# Patient Record
Sex: Male | Born: 1950
Health system: Southern US, Community
[De-identification: ages and names within clinical notes are randomized; demographics above are authoritative.]

## PROBLEM LIST (undated history)

## (undated) DIAGNOSIS — M419 Scoliosis, unspecified: Secondary | ICD-10-CM

## (undated) DIAGNOSIS — I10 Essential (primary) hypertension: Secondary | ICD-10-CM

## (undated) DIAGNOSIS — N4281 Prostatodynia syndrome: Secondary | ICD-10-CM

## (undated) DIAGNOSIS — I2510 Atherosclerotic heart disease of native coronary artery without angina pectoris: Secondary | ICD-10-CM

## (undated) DIAGNOSIS — M47816 Spondylosis without myelopathy or radiculopathy, lumbar region: Secondary | ICD-10-CM

## (undated) DIAGNOSIS — N200 Calculus of kidney: Secondary | ICD-10-CM

## (undated) DIAGNOSIS — E785 Hyperlipidemia, unspecified: Secondary | ICD-10-CM

## (undated) HISTORY — PX: OTHER SURGICAL HISTORY: SHX169

## (undated) HISTORY — PX: KNEE ARTHROSCOPY: SHX127

## (undated) HISTORY — PX: CHOLECYSTECTOMY: SHX55

## (undated) HISTORY — PX: CARDIAC CATHETERIZATION: SHX172

---

## 1998-05-26 ENCOUNTER — Observation Stay (HOSPITAL_COMMUNITY): Admission: RE | Admit: 1998-05-26 | Discharge: 1998-05-27 | Payer: Self-pay | Admitting: *Deleted

## 1998-07-18 ENCOUNTER — Emergency Department (HOSPITAL_COMMUNITY): Admission: EM | Admit: 1998-07-18 | Discharge: 1998-07-18 | Payer: Self-pay | Admitting: Emergency Medicine

## 1998-07-18 ENCOUNTER — Encounter: Payer: Self-pay | Admitting: Emergency Medicine

## 1998-07-21 ENCOUNTER — Encounter: Payer: Self-pay | Admitting: Emergency Medicine

## 1998-07-21 ENCOUNTER — Emergency Department (HOSPITAL_COMMUNITY): Admission: EM | Admit: 1998-07-21 | Discharge: 1998-07-21 | Payer: Self-pay | Admitting: Emergency Medicine

## 1999-06-22 ENCOUNTER — Encounter: Admission: RE | Admit: 1999-06-22 | Discharge: 1999-07-21 | Payer: Self-pay | Admitting: Family Medicine

## 2000-07-20 ENCOUNTER — Ambulatory Visit (HOSPITAL_COMMUNITY): Admission: RE | Admit: 2000-07-20 | Discharge: 2000-07-20 | Payer: Self-pay | Admitting: *Deleted

## 2000-11-05 ENCOUNTER — Ambulatory Visit (HOSPITAL_BASED_OUTPATIENT_CLINIC_OR_DEPARTMENT_OTHER): Admission: RE | Admit: 2000-11-05 | Discharge: 2000-11-05 | Payer: Self-pay | Admitting: Family Medicine

## 2001-08-02 ENCOUNTER — Emergency Department (HOSPITAL_COMMUNITY): Admission: EM | Admit: 2001-08-02 | Discharge: 2001-08-02 | Payer: Self-pay | Admitting: Emergency Medicine

## 2001-08-11 ENCOUNTER — Emergency Department (HOSPITAL_COMMUNITY): Admission: EM | Admit: 2001-08-11 | Discharge: 2001-08-11 | Payer: Self-pay | Admitting: Emergency Medicine

## 2003-03-11 ENCOUNTER — Encounter: Admission: RE | Admit: 2003-03-11 | Discharge: 2003-03-11 | Payer: Self-pay | Admitting: Family Medicine

## 2003-03-11 ENCOUNTER — Encounter: Payer: Self-pay | Admitting: Family Medicine

## 2004-12-14 ENCOUNTER — Ambulatory Visit (HOSPITAL_COMMUNITY): Admission: RE | Admit: 2004-12-14 | Discharge: 2004-12-14 | Payer: Self-pay | Admitting: Gastroenterology

## 2004-12-14 ENCOUNTER — Encounter (INDEPENDENT_AMBULATORY_CARE_PROVIDER_SITE_OTHER): Payer: Self-pay | Admitting: *Deleted

## 2005-09-19 ENCOUNTER — Encounter: Admission: RE | Admit: 2005-09-19 | Discharge: 2005-09-19 | Payer: Self-pay | Admitting: Orthopedic Surgery

## 2005-11-25 ENCOUNTER — Encounter: Admission: RE | Admit: 2005-11-25 | Discharge: 2005-11-25 | Payer: Self-pay | Admitting: Neurosurgery

## 2005-12-23 ENCOUNTER — Encounter: Admission: RE | Admit: 2005-12-23 | Discharge: 2005-12-23 | Payer: Self-pay | Admitting: Neurosurgery

## 2008-04-03 ENCOUNTER — Encounter: Admission: RE | Admit: 2008-04-03 | Discharge: 2008-05-16 | Payer: Self-pay | Admitting: Family Medicine

## 2008-05-23 ENCOUNTER — Ambulatory Visit (HOSPITAL_COMMUNITY): Admission: RE | Admit: 2008-05-23 | Discharge: 2008-05-23 | Payer: Self-pay | Admitting: Urology

## 2008-06-24 ENCOUNTER — Encounter: Admission: RE | Admit: 2008-06-24 | Discharge: 2008-06-24 | Payer: Self-pay | Admitting: Neurosurgery

## 2008-11-04 ENCOUNTER — Emergency Department (HOSPITAL_COMMUNITY): Admission: EM | Admit: 2008-11-04 | Discharge: 2008-11-05 | Payer: Self-pay | Admitting: Emergency Medicine

## 2008-11-16 ENCOUNTER — Emergency Department (HOSPITAL_COMMUNITY): Admission: EM | Admit: 2008-11-16 | Discharge: 2008-11-16 | Payer: Self-pay | Admitting: Emergency Medicine

## 2010-07-20 ENCOUNTER — Encounter: Admission: RE | Admit: 2010-07-20 | Discharge: 2010-07-20 | Payer: Self-pay | Admitting: Family Medicine

## 2011-02-26 NOTE — Cardiovascular Report (Signed)
[place]. [facility]  Patient:    Patrick Noble, Patrick Noble                          MRN: [id number] Proc. Date: 07/20/00 Adm. Date:  [id number] Attending:  Daisey Must CC:         Charles A. Tenny Craw, M.D.  Cath Lab   Cardiac Catheterization  PROCEDURE:  Left heart catheterization with coronary angiography and left ventriculography.  INDICATIONS:  Mr. Creppel is a 60 year old male who has been having chest pain. A stress Cardiolite in the office suggested anterior ischemia.  DESCRIPTION OF PROCEDURE:  A 6 French sheath was placed in the right femoral artery. Standard Judkins 6 French catheters were utilized.  Contrast was Omnipaque.  There were no complications.  CATHETERIZATION RESULTS:  HEMODYNAMICS:  Left ventricular pressure 130/16, aortic pressure 130/88. There is no aortic valve gradient.  LEFT VENTRICULOGRAM:  Wall motion is normal.  Ejection fraction estimated at greater than 60%.  No mitral reguritation.  CORONARY ARTERIOGRAPHY:  (Right dominant).  Left main is angiographically normal.  Left anterior descending artery gives rise to a normal size first diagonal and a normal size second diagonal.  The left anterior descending artery is an angiographically normal.  Left circumflex gives rise to a small OM1 and a large branching OM2.  Left circumflex is free of angiographic disease.  Right coronary artery gives rise to a normal size posterior descending artery and a normal size posterolateral branch.  The right coronary artery is free of angiographic disease.  IMPRESSION: 1. Normal left ventricular systolic function. 2. Angiographically normal coronary arteries. DD:  07/20/00 TD:  07/20/00 Job: [id number]

## 2011-02-26 NOTE — Op Note (Signed)
Patrick Noble, Patrick Noble                     ACCOUNT NO.:  [id number]   MEDICAL RECORD NO.:  [id number]          PATIENT TYPE:  AMB   LOCATION:  ENDO                         FACILITY:  [facility]   PHYSICIAN:  James L. Malon Kindle., M.D.DATE OF BIRTH:  1951/04/15   DATE OF PROCEDURE:  12/14/2004  DATE OF DISCHARGE:                                 OPERATIVE REPORT   PROCEDURE:  Colonoscopy and polypectomy.   MEDICATIONS:  1.  Fentanyl 125 mcg.  2.  Versed 12 mg IV.   INDICATION:  The patient has had a previous history of colon polyps removed  by another gastroenterologist.  This is done as a follow up procedure.   DESCRIPTION OF PROCEDURE:  The procedure explained to the patient and  consent obtained.  With the patient in the left lateral decubitus position,  the Olympus pediatric adjustable colonoscope was used.  Inserted the scope  and were easily able to advance to the cecum.  Ileocecal valve and  appendiceal orifice were seen.  The scope was withdrawn, and the cecum,  ascending colon were seen well.  A three-quarter cm polyp was removed with  the snare in the ascending colon, sucked up against the scope, and we  removed the scope.  There were no other polyps in the ascending colon or  transverse colon.  At some point, a polyp sucked through and was shredded,  was collected and sent for path.  In the descending colon, within 4-5 cm of  each other were two three-quarter cm semi-pedunculated polyps removed with  snare, sucked through the scope, a one-half cm polyp in the distal  descending colon was removed with the snare and sucked through the scope.  The scope was withdrawn.  The patient tolerated the procedure well.   ASSESSMENT:  Multiple polyps removed.  211.3.   PLAN:  1.  Routine postpolypectomy instructions.  2.  We will recommend repeating procedure in 3 years.      JLE/MEDQ  D:  12/14/2004  T:  12/14/2004  Job:  [id number]   cc:   Caryn Bee L. Little, M.D.  [address]  [place]  [place] [address]  Fax: [phone]

## 2011-04-02 ENCOUNTER — Other Ambulatory Visit: Payer: Self-pay | Admitting: Gastroenterology

## 2011-09-30 ENCOUNTER — Emergency Department (HOSPITAL_COMMUNITY): Payer: 59

## 2011-09-30 ENCOUNTER — Other Ambulatory Visit: Payer: Self-pay

## 2011-09-30 ENCOUNTER — Emergency Department (HOSPITAL_COMMUNITY)
Admission: EM | Admit: 2011-09-30 | Discharge: 2011-09-30 | Disposition: A | Discharge status: Home / Self Care | Payer: 59 | Attending: Emergency Medicine | Admitting: Emergency Medicine

## 2011-09-30 DIAGNOSIS — R0789 Other chest pain: Secondary | ICD-10-CM

## 2011-09-30 DIAGNOSIS — R0602 Shortness of breath: Secondary | ICD-10-CM | POA: Insufficient documentation

## 2011-09-30 DIAGNOSIS — R569 Unspecified convulsions: Secondary | ICD-10-CM | POA: Insufficient documentation

## 2011-09-30 DIAGNOSIS — M4120 Other idiopathic scoliosis, site unspecified: Secondary | ICD-10-CM | POA: Insufficient documentation

## 2011-09-30 DIAGNOSIS — G4730 Sleep apnea, unspecified: Secondary | ICD-10-CM | POA: Insufficient documentation

## 2011-09-30 DIAGNOSIS — K219 Gastro-esophageal reflux disease without esophagitis: Secondary | ICD-10-CM

## 2011-09-30 DIAGNOSIS — I10 Essential (primary) hypertension: Secondary | ICD-10-CM | POA: Insufficient documentation

## 2011-09-30 DIAGNOSIS — F411 Generalized anxiety disorder: Secondary | ICD-10-CM | POA: Insufficient documentation

## 2011-09-30 HISTORY — DX: Essential (primary) hypertension: I10

## 2011-09-30 HISTORY — DX: Spondylosis without myelopathy or radiculopathy, lumbar region: M47.816

## 2011-09-30 HISTORY — DX: Prostatodynia syndrome: N42.81

## 2011-09-30 HISTORY — DX: Scoliosis, unspecified: M41.9

## 2011-09-30 LAB — CBC
HCT: 39.6 % (ref 39.0–52.0)
Hemoglobin: 12.9 g/dL — ABNORMAL LOW (ref 13.0–17.0)
MCHC: 32.6 g/dL (ref 30.0–36.0)
Platelets: 293 10*3/uL (ref 150–400)

## 2011-09-30 LAB — COMPREHENSIVE METABOLIC PANEL
ALT: 32 U/L (ref 0–53)
Alkaline Phosphatase: 96 U/L (ref 39–117)
BUN: 22 mg/dL (ref 6–23)
Calcium: 9.4 mg/dL (ref 8.4–10.5)
Chloride: 102 mEq/L (ref 96–112)
GFR calc non Af Amer: 62 mL/min — ABNORMAL LOW (ref >90)
Total Bilirubin: 1.1 mg/dL (ref 0.3–1.2)

## 2011-09-30 LAB — DIFFERENTIAL
Basophils Relative: 0 % (ref 0–1)
Lymphocytes Relative: 21 % (ref 12–46)
Lymphs Abs: 2.7 10*3/uL (ref 0.7–4.0)
Monocytes Absolute: 0.8 10*3/uL (ref 0.1–1.0)
Monocytes Relative: 6 % (ref 3–12)
Neutrophils Relative %: 73 % (ref 43–77)

## 2011-09-30 LAB — POCT I-STAT TROPONIN I

## 2011-09-30 MED ORDER — CYCLOBENZAPRINE HCL 10 MG PO TABS: 10.0000 mg | ORAL_TABLET | Freq: Two times a day (BID) | ORAL | Status: AC | PRN

## 2011-09-30 MED ORDER — ASPIRIN 81 MG PO CHEW
324.0000 mg | CHEWABLE_TABLET | Freq: Once | ORAL | Status: AC
  Administered 2011-09-30: 324 mg via ORAL
  Filled 2011-09-30: qty 4

## 2011-09-30 NOTE — ED Notes (Signed)
Pt here c/o tightness in center of chest, originally began as midsternal pain about an hour ago.  Pt states that pain was coming and going and radiated through to his back.

## 2011-09-30 NOTE — ED Provider Notes (Addendum)
History     CSN: [id number]  Arrival date & time 09/30/11  1245   First MD Initiated Contact with Patient 09/30/11 1258      Chief Complaint  Patient presents with  . Chest Pain    (Consider location/radiation/quality/duration/timing/severity/associated sxs/prior treatment) Patient is a 60 y.o. male presenting with chest pain. The history is provided by the patient. No language interpreter was used.  Chest Pain The chest pain began 3 - 5 hours ago. Chest pain occurs rarely. The chest pain is resolved. At its most intense, the pain is at 2/10. The pain is currently at 0/10. The quality of the pain is described as sharp and tightness. The pain radiates to the left shoulder. Chest pain is worsened by certain positions. Pertinent negatives for primary symptoms include no fever, no shortness of breath, no cough, no wheezing, no nausea, no vomiting and no dizziness.  Associated symptoms include diaphoresis.  Pertinent negatives for associated symptoms include no lower extremity edema, no near-syncope and no numbness. He tried nothing for the symptoms. Risk factors include lack of exercise and male gender.  His past medical history is significant for anxiety/panic attacks, hypertension, seizures and sleep apnea.  Pertinent negatives for past medical history include no aneurysm, no aortic dissection, no arrhythmia, no bicuspid aortic valve, no CAD, no cancer, no COPD, no CHF, no diabetes, no DVT, no hyperlipidemia, no MI, no PE, no PVD, no rheumatic fever, no stimulant use, no strokes and no thyroid problem.  His family medical history is significant for CAD in family, diabetes in family, heart disease in family, hyperlipidemia in family, hypertension in family and early MI in family.  Pertinent negatives for family medical history include: no PE in family, no stroke in family and no sudden death in family.  Procedure history is positive for cardiac catheterization, echocardiogram, persantine  thallium, stress echo, stress thallium and exercise treadmill test.   c/o midsternal sharp cp lasting a few seconds at a time.  States that the pain goes through to his L scapula where he has point tenderness.  The pain is intermittant and started 1 hour ago while he was sitting at work.  No nausea or vomiting.  SOB briefly.  The only risk factor seems to be hypertension. He does have scoliosis of the spine.   Past Medical History  Diagnosis Date  . Hypertension   . Prostate pain   . Scoliosis   . Degenerative arthritis of lumbar spine     Past Surgical History  Procedure Date  . Cholecystectomy   . Knee arthroscopy     No family history on file.  History  Substance Use Topics  . Smoking status: Former Games developer  . Smokeless tobacco: Not on file  . Alcohol Use: Yes      Review of Systems  Constitutional: Positive for diaphoresis. Negative for fever.  Respiratory: Negative for cough, shortness of breath and wheezing.   Cardiovascular: Positive for chest pain. Negative for leg swelling and near-syncope.  Gastrointestinal: Negative for nausea and vomiting.  Musculoskeletal:       R scapula pain resolved  Neurological: Positive for seizures. Negative for dizziness and numbness.  All other systems reviewed and are negative.    Allergies  Review of patient's allergies indicates no known allergies.  Home Medications   Current Outpatient Rx  Name Route Sig Dispense Refill  . DUTASTERIDE 0.5 MG PO CAPS Oral Take 0.5 mg by mouth daily.      [name] HYDROCODONE-ACETAMINOPHEN 5-500 MG  PO TABS Oral Take 1 tablet by mouth every 8 (eight) hours as needed. As needed for pain.     [name] LISINOPRIL 10 MG PO TABS Oral Take 10 mg by mouth daily.      [name] PAROXETINE HCL 40 MG PO TABS Oral Take 40 mg by mouth every evening.      . CYCLOBENZAPRINE HCL 10 MG PO TABS Oral Take 1 tablet (10 mg total) by mouth 2 (two) times daily as needed for muscle spasms. 20 tablet 0    BP 118/61  Pulse 88   Temp(Src) 98.3 F (36.8 C) (Oral)  Resp 18  Ht 6\' 5"  (1.956 m)  Wt 220 lb (99.791 kg)  BMI 26.09 kg/m2  SpO2 97%  Physical Exam  Nursing note and vitals reviewed. Constitutional: He is oriented to person, place, and time. He appears well-developed and well-nourished. No distress.  HENT:  Head: Normocephalic.  Eyes: EOM are normal. Pupils are equal, round, and reactive to light.  Neck: Normal range of motion.  Cardiovascular: Normal rate, regular rhythm, normal heart sounds and intact distal pulses.  Exam reveals no gallop and no friction rub.   No murmur heard. Pulmonary/Chest: Effort normal and breath sounds normal. No respiratory distress. He has no wheezes.  Abdominal: Soft.  Musculoskeletal:       R scapula pain with deep palpatation  Neurological: He is alert and oriented to person, place, and time.  Skin: Skin is warm and dry. He is not diaphoretic.  Psychiatric: He has a normal mood and affect.    ED Course  Procedures (including critical care time)  Labs Reviewed  CBC - Abnormal; Notable for the following:    WBC 12.9 (*)    RBC 5.90 (*)    Hemoglobin 12.9 (*)    MCV 67.1 (*)    MCH 21.9 (*)    All other components within normal limits  DIFFERENTIAL - Abnormal; Notable for the following:    Neutro Abs 9.4 (*)    All other components within normal limits  COMPREHENSIVE METABOLIC PANEL - Abnormal; Notable for the following:    Glucose, Bld 111 (*)    GFR calc non Af Amer 62 (*)    GFR calc Af Amer 72 (*)    All other components within normal limits  POCT I-STAT TROPONIN I  LAB REPORT - SCANNED   Dg Chest Port 1 View  09/30/2011  *RADIOLOGY REPORT*  Clinical Data: Chest pain  PORTABLE CHEST - 1 VIEW  Comparison: None.  Findings: The lungs are clear without focal infiltrate, edema, pneumothorax or pleural effusion. Cardiopericardial silhouette is at upper limits of normal for size.  Convex rightward thoracic scoliosis noted. Telemetry leads overlie the chest.   IMPRESSION: No acute findings.  Original Report Authenticated By: ERIC A. MANSELL, M.D.     1. Chest pain   2. Musculoskeletal chest pain   3. GERD (gastroesophageal reflux disease)       MDM   Date: 09/30/2011  Rate: 106  Rhythm: sinus tachycardia  QRS Axis: normal  Intervals: normal   ST/T Wave abnormalities: normal  Conduction Disutrbances:none  Narrative Interpretation:   Old EKG Reviewed: unchanged  Low risk for acs.  Point tenderness to L scapula pain.   No further chest pain in the ER.  Was midsternal fleeting pain lasting a few seconds at a time.   EKG tachy but pulse was in the 80;s otherwise and markers normal.   Low risk for PE.   Labs  Reviewed  CBC - Abnormal; Notable for the following:    WBC 12.9 (*)    RBC 5.90 (*)    Hemoglobin 12.9 (*)    MCV 67.1 (*)    MCH 21.9 (*)    All other components within normal limits  DIFFERENTIAL - Abnormal; Notable for the following:    Neutro Abs 9.4 (*)    All other components within normal limits  COMPREHENSIVE METABOLIC PANEL - Abnormal; Notable for the following:    Glucose, Bld 111 (*)    GFR calc non Af Amer 62 (*)    GFR calc Af Amer 72 (*)    All other components within normal limits  POCT I-STAT TROPONIN I  LAB REPORT - SCANNED          Jethro Bastos, NP 10/01/11 2236  Jethro Bastos, NP 12/22/11 1947

## 2011-09-30 NOTE — ED Notes (Signed)
Pt presents with onset of midsternal chest pain x 1 hour ago.  Pt reports pain has been intermittent and radiates through to mid scapula.  "a little" shortness of breath and diaphoresis at onset.  Pt describes now as a tightness.

## 2011-10-06 NOTE — ED Provider Notes (Signed)
Medical screening examination/treatment/procedure(s) were conducted as a shared visit with non-physician practitioner(s) and myself.  I personally evaluated the patient during the encounter.  Low risk for acute coronary syndrome. Pain is fleeting.  Does not fit model for PE either  Donnetta Hutching, MD 10/06/11 [phone]

## 2011-12-28 NOTE — ED Provider Notes (Signed)
Medical screening examination/treatment/procedure(s) were conducted as a shared visit with non-physician practitioner(s) and myself.  I personally evaluated the patient during the encounter.  Patient low risk for acute coronary syndrome. Symptoms are fleeting. Can followup as outpatient  Donnetta Hutching, MD 12/28/11 0001

## 2014-05-30 ENCOUNTER — Other Ambulatory Visit: Payer: Self-pay | Admitting: Gastroenterology

## 2016-05-14 ENCOUNTER — Emergency Department (HOSPITAL_COMMUNITY)
Admission: EM | Admit: 2016-05-14 | Discharge: 2016-05-14 | Disposition: A | Discharge status: Home / Self Care | Payer: 59 | Attending: Emergency Medicine | Admitting: Emergency Medicine

## 2016-05-14 ENCOUNTER — Encounter (HOSPITAL_COMMUNITY): Payer: Self-pay | Admitting: Emergency Medicine

## 2016-05-14 ENCOUNTER — Emergency Department (HOSPITAL_COMMUNITY): Payer: 59

## 2016-05-14 DIAGNOSIS — Z79899 Other long term (current) drug therapy: Secondary | ICD-10-CM | POA: Diagnosis not present

## 2016-05-14 DIAGNOSIS — Z87891 Personal history of nicotine dependence: Secondary | ICD-10-CM | POA: Diagnosis not present

## 2016-05-14 DIAGNOSIS — I10 Essential (primary) hypertension: Secondary | ICD-10-CM | POA: Diagnosis not present

## 2016-05-14 DIAGNOSIS — R1032 Left lower quadrant pain: Secondary | ICD-10-CM | POA: Diagnosis present

## 2016-05-14 DIAGNOSIS — N200 Calculus of kidney: Secondary | ICD-10-CM | POA: Diagnosis not present

## 2016-05-14 HISTORY — DX: Calculus of kidney: N20.0

## 2016-05-14 LAB — URINALYSIS, ROUTINE W REFLEX MICROSCOPIC
BILIRUBIN URINE: NEGATIVE
Glucose, UA: 100 mg/dL — AB
Ketones, ur: NEGATIVE mg/dL
Leukocytes, UA: NEGATIVE
Nitrite: NEGATIVE
PROTEIN: NEGATIVE mg/dL
SPECIFIC GRAVITY, URINE: 1.027 (ref 1.005–1.030)
pH: 5.5 (ref 5.0–8.0)

## 2016-05-14 LAB — CBC WITH DIFFERENTIAL/PLATELET
Basophils Absolute: 0 10*3/uL (ref 0.0–0.1)
Basophils Relative: 0 %
Eosinophils Absolute: 0 10*3/uL (ref 0.0–0.7)
Eosinophils Relative: 0 %
HCT: 39 % (ref 39.0–52.0)
Hemoglobin: 12.5 g/dL — ABNORMAL LOW (ref 13.0–17.0)
Lymphocytes Relative: 13 %
Lymphs Abs: 1.9 10*3/uL (ref 0.7–4.0)
MCH: 21.7 pg — ABNORMAL LOW (ref 26.0–34.0)
MCHC: 32.1 g/dL (ref 30.0–36.0)
MCV: 67.7 fL — ABNORMAL LOW (ref 78.0–100.0)
Monocytes Absolute: 1.3 10*3/uL — ABNORMAL HIGH (ref 0.1–1.0)
Monocytes Relative: 9 %
Neutro Abs: 11.1 10*3/uL — ABNORMAL HIGH (ref 1.7–7.7)
Neutrophils Relative %: 78 %
Platelets: 250 10*3/uL (ref 150–400)
RBC: 5.76 MIL/uL (ref 4.22–5.81)
RDW: 14.1 % (ref 11.5–15.5)
WBC: 14.3 10*3/uL — ABNORMAL HIGH (ref 4.0–10.5)

## 2016-05-14 LAB — URINE MICROSCOPIC-ADD ON

## 2016-05-14 LAB — COMPREHENSIVE METABOLIC PANEL
ALT: 19 U/L (ref 17–63)
AST: 22 U/L (ref 15–41)
Albumin: 4.8 g/dL (ref 3.5–5.0)
Alkaline Phosphatase: 85 U/L (ref 38–126)
Anion gap: 9 (ref 5–15)
BUN: 21 mg/dL — ABNORMAL HIGH (ref 6–20)
CO2: 25 mmol/L (ref 22–32)
Calcium: 9.3 mg/dL (ref 8.9–10.3)
Chloride: 105 mmol/L (ref 101–111)
Creatinine, Ser: 1.72 mg/dL — ABNORMAL HIGH (ref 0.61–1.24)
GFR calc Af Amer: 47 mL/min — ABNORMAL LOW (ref >60)
GFR calc non Af Amer: 41 mL/min — ABNORMAL LOW (ref >60)
Glucose, Bld: 119 mg/dL — ABNORMAL HIGH (ref 65–99)
Potassium: 3.8 mmol/L (ref 3.5–5.1)
Sodium: 139 mmol/L (ref 135–145)
Total Bilirubin: 1 mg/dL (ref 0.3–1.2)
Total Protein: 8.1 g/dL (ref 6.5–8.1)

## 2016-05-14 LAB — LIPASE, BLOOD: Lipase: 26 U/L (ref 11–51)

## 2016-05-14 MED ORDER — KETOROLAC TROMETHAMINE 10 MG PO TABS: 10.0000 mg | ORAL_TABLET | Freq: Four times a day (QID) | ORAL | 0 refills | Status: DC | PRN

## 2016-05-14 MED ORDER — KETOROLAC TROMETHAMINE 15 MG/ML IJ SOLN
15.0000 mg | Freq: Once | INTRAMUSCULAR | Status: AC
  Administered 2016-05-14: 15 mg via INTRAVENOUS
  Filled 2016-05-14: qty 1

## 2016-05-14 MED ORDER — TAMSULOSIN HCL 0.4 MG PO CAPS
0.4000 mg | ORAL_CAPSULE | Freq: Once | ORAL | Status: AC
  Administered 2016-05-14: 0.4 mg via ORAL
  Filled 2016-05-14: qty 1

## 2016-05-14 NOTE — ED Provider Notes (Signed)
[place] DEPT Provider Note   CSN: [id number] Arrival date & time: 05/14/16  [id number]  First Provider Contact:   First MD Initiated Contact with Patient 05/14/16 2145     By signing my name below, I, Reola Mosher, attest that this documentation has been prepared under the direction and in the presence of [employer], PA-C.  Electronically Signed: Reola Mosher, ED Scribe. 05/14/16. 9:53 PM.  History   Chief Complaint Chief Complaint  Patient presents with  . Flank Pain   The history is provided by the patient. No language interpreter was used.   HPI Comments: Patrick Noble is a 65 y.o. male with a PMHx of Scoliosis, HTN, and Nephrolithiasis, who presents to the Emergency Department complaining of sudden onset, gradually worsening, constant, throbbing left-sided flank pain onset yesterday PM PTA. He endorses his pain radiating around to his left, lower abdomen, and states that he woke up with the pain. Pt reports associated intermittent nausea, mild dysuria, and fever (TMAX 99.2) since the onset of his pain. Pt took 5mg  of Vicodin (last dose ~5 hours ago) PTA with minimal relief of his pain. He reports a hx of back pain, but but denies the pain today being similar. He notes that he has a hx of renal calculi in the past, and notes that his pain today is similar to his previous hx of pain. His last physical was ~1 month ago with no remarkable abnormalities. No recent injuries or trauma to the area. No hx of GI Bleeds. Denies frequency, urgency, hematuria, vomiting, or any other symptoms.   Past Medical History:  Diagnosis Date  . Degenerative arthritis of lumbar spine   . Hypertension   . Kidney stone   . Prostate pain   . Scoliosis    There are no active problems to display for this patient.  Past Surgical History:  Procedure Laterality Date  . CHOLECYSTECTOMY    . KNEE ARTHROSCOPY      Home Medications    Prior to Admission medications   Medication Sig Start Date  End Date Taking? Authorizing Provider  acetaminophen (TYLENOL) 500 MG tablet Take 1,000 mg by mouth every 6 (six) hours as needed for mild pain, moderate pain or headache.   Yes Historical Provider, MD  benzonatate (TESSALON) 100 MG capsule Take 100 mg by mouth 3 (three) times daily as needed for cough.   Yes Historical Provider, MD  Cholecalciferol (HM VITAMIN D3) 4000 units CAPS Take 4,000 Units by mouth daily.   Yes Historical Provider, MD  dutasteride (AVODART) 0.5 MG capsule Take 0.5 mg by mouth daily.     Yes Historical Provider, MD  fluticasone (FLONASE) 50 MCG/ACT nasal spray Place 2 sprays into both nostrils daily.   Yes Historical Provider, MD  HYDROcodone-acetaminophen (NORCO/VICODIN) 5-325 MG tablet Take 1 tablet by mouth every 6 (six) hours as needed for moderate pain.   Yes Historical Provider, MD  lisinopril (PRINIVIL,ZESTRIL) 10 MG tablet Take 10 mg by mouth daily.     Yes Historical Provider, MD  loratadine (CLARITIN) 10 MG tablet Take 10 mg by mouth daily.   Yes Historical Provider, MD  Multiple Vitamin (MULTIVITAMIN WITH MINERALS) TABS tablet Take 1 tablet by mouth daily.   Yes Historical Provider, MD  omega-3 acid ethyl esters (LOVAZA) 1 g capsule Take 2 g by mouth daily.   Yes Historical Provider, MD  OVER THE COUNTER MEDICATION Take 1 capsule by mouth daily. Medication:  Prosta-Q   Yes Historical Provider, MD  PARoxetine (PAXIL) 40 MG tablet Take 40 mg by mouth at bedtime.    Yes Historical Provider, MD  ketorolac (TORADOL) 10 MG tablet Take 1 tablet (10 mg total) by mouth every 6 (six) hours as needed. 05/14/16   Okey Regal, PA-C   Family History History reviewed. No pertinent family history.  Social History Social History  Substance Use Topics  . Smoking status: Former Research scientist (life sciences)  . Smokeless tobacco: Never Used  . Alcohol use Yes   Allergies   Review of patient's allergies indicates no known allergies.  Review of Systems Review of Systems  Constitutional: Positive  for fever (TMAX 99.2).  Gastrointestinal: Positive for nausea. Negative for vomiting.  Genitourinary: Positive for dysuria and flank pain (left-sided). Negative for frequency, hematuria and urgency.  All other systems reviewed and are negative.  Physical Exam Updated Vital Signs BP 130/75 (BP Location: Left Arm)   Pulse 75   Temp 99.4 F (37.4 C) (Oral)   Resp 16   Ht 6\' 4"  (1.93 m)   Wt 95.3 kg   SpO2 94%   BMI 25.56 kg/m   Physical Exam  Constitutional: He is oriented to person, place, and time. He appears well-developed and well-nourished.  HENT:  Head: Normocephalic and atraumatic.  Eyes: Conjunctivae are normal. Pupils are equal, round, and reactive to light. Right eye exhibits no discharge. Left eye exhibits no discharge. No scleral icterus.  Neck: Normal range of motion. No JVD present. No tracheal deviation present.  Pulmonary/Chest: Effort normal. No stridor.  Abdominal: Soft. He exhibits no distension. There is no tenderness. There is no rebound, no guarding and no CVA tenderness.  Neurological: He is alert and oriented to person, place, and time. Coordination normal.  Psychiatric: He has a normal mood and affect. His behavior is normal. Judgment and thought content normal.  Nursing note and vitals reviewed.  ED Treatments / Results  DIAGNOSTIC STUDIES: Oxygen Saturation is 98% on RA, normal by my interpretation.   COORDINATION OF CARE: 9:53 PM-Discussed next steps with pt. Pt verbalized understanding and is agreeable with the plan.   Labs (all labs ordered are listed, but only abnormal results are displayed) Labs Reviewed  URINALYSIS, ROUTINE W REFLEX MICROSCOPIC (NOT AT [facility]) - Abnormal; Notable for the following:       Result Value   Glucose, UA 100 (*)    Hgb urine dipstick MODERATE (*)    All other components within normal limits  URINE MICROSCOPIC-ADD ON - Abnormal; Notable for the following:    Squamous Epithelial / LPF 0-5 (*)    Bacteria, UA RARE  (*)    All other components within normal limits  COMPREHENSIVE METABOLIC PANEL - Abnormal; Notable for the following:    Glucose, Bld 119 (*)    BUN 21 (*)    Creatinine, Ser 1.72 (*)    GFR calc non Af Amer 41 (*)    GFR calc Af Amer 47 (*)    All other components within normal limits  CBC WITH DIFFERENTIAL/PLATELET - Abnormal; Notable for the following:    WBC 14.3 (*)    Hemoglobin 12.5 (*)    MCV 67.7 (*)    MCH 21.7 (*)    Neutro Abs 11.1 (*)    Monocytes Absolute 1.3 (*)    All other components within normal limits  LIPASE, BLOOD   Radiology Ct Renal Stone Study  Result Date: 05/14/2016 CLINICAL DATA:  Left flank pain since this morning. History of kidney stones. Dysuria. EXAM: CT  ABDOMEN AND PELVIS WITHOUT CONTRAST TECHNIQUE: Multidetector CT imaging of the abdomen and pelvis was performed following the standard protocol without IV contrast. COMPARISON:  None. FINDINGS: Lower chest:  No acute findings. Hepatobiliary: Status post cholecystectomy. No mass or focal lesion identified in the liver on this unenhanced exam. Pancreas: No mass or inflammatory process identified on this un-enhanced exam. Spleen: Within normal limits in size. Adrenals/Urinary Tract: Obstructing 6 x 4 mm stone within the distal left ureter causing moderate hydronephrosis. 6 mm left renal stone. Several additional smaller stones within each renal pelvis. Bladder is unremarkable. Stomach/Bowel: Bowel is normal in caliber. No bowel wall thickening or evidence of bowel wall inflammation. Appendix is normal. Vascular/Lymphatic: Mild atherosclerotic changes of the normal caliber abdominal aorta. No enlarged lymph nodes seen. Reproductive: No mass or other significant abnormality. Other: None. Musculoskeletal: Degenerative changes throughout the scoliotic thoracolumbar spine. No acute or suspicious osseous finding. IMPRESSION: 1. Obstructing 6 x 4 mm stone within the distal left ureter, located approximately 5 cm proximal  to the left UVJ, causing moderate hydronephrosis with associated perinephric inflammation. 2. Bilateral nephrolithiasis. 3. Aortic atherosclerosis. Electronically Signed   By: Franki Cabot M.D.   On: 05/14/2016 22:24    Procedures Procedures   Medications Ordered in ED Medications  ketorolac (TORADOL) 15 MG/ML injection 15 mg (15 mg Intravenous Given 05/14/16 2205)  tamsulosin (FLOMAX) capsule 0.4 mg (0.4 mg Oral Given 05/14/16 2312)   Initial Impression / Assessment and Plan / ED Course  I have reviewed the triage vital signs and the nursing notes.  Pertinent labs & imaging results that were available during my care of the patient were reviewed by me and considered in my medical decision making (see chart for details).  Clinical Course    Labs: UA, Lipase, CMP  Imaging: CT Renal Stone Study  Consults:   Therapeutics: Toradol Injection   Discharge Meds: Toradol  Assessment/Plan:  65 year old male presents today with flank pain. Patient's symptoms most consistent with nephrolithiasis. Patient has a history of the same. He is afebrile and nontoxic here in the ED. He has no signs of urinary tract infection, stone likely will pass on its own. Asian will be instructed to increase fluid intake, use Toradol only as needed for severe pain, inform his urologist at today's visit and all relevant data. He is instructed return immediately if any new or worsening signs or symptoms present. He is instructed to follow-up with urologist first thing Monday morning for reevaluation. Patient verbalized understanding and agreement today's plan had no further questions concerns at the time discharge.  Final Clinical Impressions(s) / ED Diagnoses   Final diagnoses:  Nephrolithiasis    New Prescriptions New Prescriptions   KETOROLAC (TORADOL) 10 MG TABLET    Take 1 tablet (10 mg total) by mouth every 6 (six) hours as needed.   I personally performed the services described in this documentation, which  was scribed in my presence. The recorded information has been reviewed and is accurate.    Okey Regal, PA-C 05/14/16 2322    Gareth Morgan, MD 05/15/16 [phone]

## 2016-05-14 NOTE — ED Triage Notes (Signed)
Pt reports L flank pain since this am. Hx of kidney stones, feels similar. No recent injuries. Has dysuria as well

## 2016-05-14 NOTE — ED Notes (Signed)
Patient transported to CT 

## 2016-05-14 NOTE — Discharge Instructions (Signed)
Please contact your urologist and inform him of today's visit and all relevant data. Please use medication only as needed, please follow-up immediately if new or worsening signs or symptoms present.

## 2017-05-12 ENCOUNTER — Other Ambulatory Visit: Payer: Self-pay | Admitting: Orthopedic Surgery

## 2017-05-12 DIAGNOSIS — M546 Pain in thoracic spine: Secondary | ICD-10-CM

## 2017-05-17 ENCOUNTER — Other Ambulatory Visit: Payer: Self-pay | Admitting: Orthopedic Surgery

## 2017-05-17 DIAGNOSIS — M4726 Other spondylosis with radiculopathy, lumbar region: Secondary | ICD-10-CM

## 2017-05-24 ENCOUNTER — Ambulatory Visit
Admission: RE | Admit: 2017-05-24 | Discharge: 2017-05-24 | Disposition: A | Discharge status: Home / Self Care | Payer: 59 | Source: Ambulatory Visit | Attending: Orthopedic Surgery | Admitting: Orthopedic Surgery

## 2017-05-24 ENCOUNTER — Other Ambulatory Visit: Payer: 59

## 2017-05-24 DIAGNOSIS — M4726 Other spondylosis with radiculopathy, lumbar region: Secondary | ICD-10-CM

## 2017-05-24 DIAGNOSIS — M546 Pain in thoracic spine: Secondary | ICD-10-CM

## 2017-10-24 ENCOUNTER — Emergency Department (HOSPITAL_COMMUNITY): Payer: 59

## 2017-10-24 ENCOUNTER — Encounter (HOSPITAL_COMMUNITY)
Admission: EM | Disposition: A | Discharge status: Home / Self Care | Payer: Self-pay | Source: Home / Self Care | Attending: Emergency Medicine

## 2017-10-24 ENCOUNTER — Other Ambulatory Visit: Payer: Self-pay | Admitting: Urology

## 2017-10-24 ENCOUNTER — Encounter (HOSPITAL_COMMUNITY): Payer: Self-pay | Admitting: Emergency Medicine

## 2017-10-24 ENCOUNTER — Other Ambulatory Visit: Payer: Self-pay

## 2017-10-24 ENCOUNTER — Observation Stay (HOSPITAL_COMMUNITY)
Admission: EM | Admit: 2017-10-24 | Discharge: 2017-10-24 | Disposition: A | Discharge status: Home / Self Care | Payer: 59 | Attending: Urology | Admitting: Urology

## 2017-10-24 DIAGNOSIS — I10 Essential (primary) hypertension: Secondary | ICD-10-CM | POA: Insufficient documentation

## 2017-10-24 DIAGNOSIS — N132 Hydronephrosis with renal and ureteral calculous obstruction: Principal | ICD-10-CM | POA: Insufficient documentation

## 2017-10-24 DIAGNOSIS — N411 Chronic prostatitis: Secondary | ICD-10-CM | POA: Diagnosis not present

## 2017-10-24 DIAGNOSIS — Z539 Procedure and treatment not carried out, unspecified reason: Secondary | ICD-10-CM | POA: Diagnosis not present

## 2017-10-24 DIAGNOSIS — Z87891 Personal history of nicotine dependence: Secondary | ICD-10-CM | POA: Insufficient documentation

## 2017-10-24 DIAGNOSIS — M479 Spondylosis, unspecified: Secondary | ICD-10-CM | POA: Insufficient documentation

## 2017-10-24 DIAGNOSIS — N201 Calculus of ureter: Secondary | ICD-10-CM

## 2017-10-24 DIAGNOSIS — N289 Disorder of kidney and ureter, unspecified: Secondary | ICD-10-CM

## 2017-10-24 DIAGNOSIS — N200 Calculus of kidney: Secondary | ICD-10-CM

## 2017-10-24 DIAGNOSIS — Z79899 Other long term (current) drug therapy: Secondary | ICD-10-CM | POA: Insufficient documentation

## 2017-10-24 DIAGNOSIS — M419 Scoliosis, unspecified: Secondary | ICD-10-CM | POA: Diagnosis not present

## 2017-10-24 LAB — BASIC METABOLIC PANEL
ANION GAP: 10 (ref 5–15)
BUN: 20 mg/dL (ref 6–20)
CHLORIDE: 106 mmol/L (ref 101–111)
CO2: 24 mmol/L (ref 22–32)
CREATININE: 1.5 mg/dL — AB (ref 0.61–1.24)
Calcium: 9.7 mg/dL (ref 8.9–10.3)
GFR calc non Af Amer: 47 mL/min — ABNORMAL LOW (ref >60)
GFR, EST AFRICAN AMERICAN: 55 mL/min — AB (ref >60)
Glucose, Bld: 159 mg/dL — ABNORMAL HIGH (ref 65–99)
POTASSIUM: 4.3 mmol/L (ref 3.5–5.1)
Sodium: 140 mmol/L (ref 135–145)

## 2017-10-24 LAB — URINALYSIS, ROUTINE W REFLEX MICROSCOPIC
BILIRUBIN URINE: NEGATIVE
Glucose, UA: NEGATIVE mg/dL
Hgb urine dipstick: NEGATIVE
KETONES UR: NEGATIVE mg/dL
LEUKOCYTES UA: NEGATIVE
Nitrite: NEGATIVE
Protein, ur: NEGATIVE mg/dL
Specific Gravity, Urine: 1.021 (ref 1.005–1.030)
pH: 5 (ref 5.0–8.0)

## 2017-10-24 LAB — CBC WITH DIFFERENTIAL/PLATELET
BASOS PCT: 0 %
Basophils Absolute: 0 10*3/uL (ref 0.0–0.1)
EOS PCT: 0 %
Eosinophils Absolute: 0 10*3/uL (ref 0.0–0.7)
HEMATOCRIT: 40.2 % (ref 39.0–52.0)
Hemoglobin: 13.1 g/dL (ref 13.0–17.0)
LYMPHS PCT: 7 %
Lymphs Abs: 1.2 10*3/uL (ref 0.7–4.0)
MCH: 21.6 pg — AB (ref 26.0–34.0)
MCHC: 32.6 g/dL (ref 30.0–36.0)
MCV: 66.2 fL — AB (ref 78.0–100.0)
Monocytes Absolute: 0.7 10*3/uL (ref 0.1–1.0)
Monocytes Relative: 4 %
NEUTROS PCT: 89 %
Neutro Abs: 14.8 10*3/uL — ABNORMAL HIGH (ref 1.7–7.7)
Platelets: 278 10*3/uL (ref 150–400)
RBC: 6.07 MIL/uL — AB (ref 4.22–5.81)
RDW: 14.5 % (ref 11.5–15.5)
WBC: 16.7 10*3/uL — AB (ref 4.0–10.5)

## 2017-10-24 SURGERY — CYSTOSCOPY, WITH RETROGRADE PYELOGRAM AND URETERAL STENT INSERTION
Anesthesia: General | Laterality: Bilateral

## 2017-10-24 MED ORDER — MORPHINE SULFATE (PF) 2 MG/ML IV SOLN: 2.0000 mg | INTRAVENOUS | Status: DC | PRN

## 2017-10-24 MED ORDER — SENNOSIDES-DOCUSATE SODIUM 8.6-50 MG PO TABS: 1.0000 | ORAL_TABLET | Freq: Every evening | ORAL | Status: DC | PRN

## 2017-10-24 MED ORDER — ACETAMINOPHEN 325 MG PO TABS: 650.0000 mg | ORAL_TABLET | Freq: Four times a day (QID) | ORAL | Status: DC | PRN

## 2017-10-24 MED ORDER — FENTANYL CITRATE (PF) 100 MCG/2ML IJ SOLN
50.0000 ug | Freq: Once | INTRAMUSCULAR | Status: AC
  Administered 2017-10-24: 50 ug via INTRAVENOUS
  Filled 2017-10-24: qty 2

## 2017-10-24 MED ORDER — ONDANSETRON HCL 4 MG/2ML IJ SOLN: 4.0000 mg | Freq: Four times a day (QID) | INTRAMUSCULAR | Status: DC | PRN

## 2017-10-24 MED ORDER — DEXTROSE-NACL 5-0.45 % IV SOLN: INTRAVENOUS | Status: DC

## 2017-10-24 MED ORDER — DOCUSATE SODIUM 100 MG PO CAPS: 100.0000 mg | ORAL_CAPSULE | Freq: Two times a day (BID) | ORAL | Status: DC

## 2017-10-24 MED ORDER — ONDANSETRON HCL 4 MG/2ML IJ SOLN
4.0000 mg | Freq: Once | INTRAMUSCULAR | Status: AC
  Administered 2017-10-24: 4 mg via INTRAVENOUS
  Filled 2017-10-24: qty 2

## 2017-10-24 MED ORDER — ACETAMINOPHEN 650 MG RE SUPP: 650.0000 mg | Freq: Four times a day (QID) | RECTAL | Status: DC | PRN

## 2017-10-24 MED ORDER — TAMSULOSIN HCL 0.4 MG PO CAPS: 0.4000 mg | ORAL_CAPSULE | Freq: Every day | ORAL | Status: DC

## 2017-10-24 MED ORDER — SODIUM CHLORIDE 0.9% FLUSH: 3.0000 mL | Freq: Two times a day (BID) | INTRAVENOUS | Status: DC

## 2017-10-24 MED ORDER — OXYCODONE HCL 5 MG PO TABS: 5.0000 mg | ORAL_TABLET | ORAL | Status: DC | PRN

## 2017-10-24 MED ORDER — ONDANSETRON HCL 4 MG PO TABS: 4.0000 mg | ORAL_TABLET | Freq: Four times a day (QID) | ORAL | Status: DC | PRN

## 2017-10-24 NOTE — H&P (Signed)
Urology Admission H&P Note    Requesting Attending Physician:  Rolland Porter, MD Service Requesting Consult:  Emergency Department Service Providing Admission: Urology  Admitting attending: Dr. Burman Nieves   Reason for Consult:  Obstructing bilateral ureteral stones  Patrick Noble is seen in consultation for reasons noted above at the request of Rolland Porter, MD of the Emergency Department   This is a 67 y.o. yo patient with a history of chronic prostatis and nephrolithiasis followed by Dr. Amalia Hailey of [facility]. Has a history of ureteroscopy on the left side performed by Dr. Amalia Hailey. He presents today with several hours of right flank pain and nausea. He denies fevers chills vomiting. He endorses dysuria and frequency, however this is chronic for him. He takes avodart for his prostatitis. He has an appointment to see Dr. Amalia Hailey on Thursday.   ED workup notable for leukocytosis to 17.6, creatinine 1.5 (baseline ~1.3). UA negative for infection.  Past Medical History: Past Medical History:  Diagnosis Date  . Degenerative arthritis of lumbar spine   . Hypertension   . Kidney stone   . Prostate pain   . Scoliosis     Past Surgical History:  Past Surgical History:  Procedure Laterality Date  . CHOLECYSTECTOMY    . KNEE ARTHROSCOPY      Medication: No current facility-administered medications for this encounter.    Current Outpatient Medications  Medication Sig Dispense Refill  . acetaminophen (TYLENOL) 500 MG tablet Take 1,000 mg by mouth every 6 (six) hours as needed for mild pain, moderate pain or headache.    . benzonatate (TESSALON) 100 MG capsule Take 100 mg by mouth 3 (three) times daily as needed for cough.    . Cholecalciferol (HM VITAMIN D3) 4000 units CAPS Take 4,000 Units by mouth daily.    [name] dutasteride (AVODART) 0.5 MG capsule Take 0.5 mg by mouth daily.      . fluticasone (FLONASE) 50 MCG/ACT nasal spray Place 2 sprays into both nostrils daily.    [name] HYDROcodone-acetaminophen  (NORCO/VICODIN) 5-325 MG tablet Take 1 tablet by mouth every 6 (six) hours as needed for moderate pain.    [name] ketorolac (TORADOL) 10 MG tablet Take 1 tablet (10 mg total) by mouth every 6 (six) hours as needed. 20 tablet 0  . lisinopril (PRINIVIL,ZESTRIL) 10 MG tablet Take 10 mg by mouth daily.      [name] loratadine (CLARITIN) 10 MG tablet Take 10 mg by mouth daily.    . Multiple Vitamin (MULTIVITAMIN WITH MINERALS) TABS tablet Take 1 tablet by mouth daily.    [name] omega-3 acid ethyl esters (LOVAZA) 1 g capsule Take 2 g by mouth daily.    [name] OVER THE COUNTER MEDICATION Take 1 capsule by mouth daily. Medication:  Prosta-Q    . PARoxetine (PAXIL) 40 MG tablet Take 40 mg by mouth at bedtime.       Allergies: No Known Allergies  Social History: Social History   Tobacco Use  . Smoking status: Former Research scientist (life sciences)  . Smokeless tobacco: Never Used  Substance Use Topics  . Alcohol use: Yes  . Drug use: No    Family History History reviewed. No pertinent family history.  Review of Systems 10 systems were reviewed and are negative except as noted specifically in the HPI.  Objective   Vital signs in last 24 hours: BP (!) 178/102   Pulse 91   Temp 98.1 F (36.7 C) (Oral)   Resp 18   SpO2 98%   Intake/Output  last 3 shifts: No intake/output data recorded.  Physical Exam General: NAD, A&O, resting, appropriate HEENT: [place]/AT, EOMI, MMM Pulmonary: Normal work of breathing on room air Cardiovascular: HDS, adequate peripheral perfusion Abdomen: soft, NTTP, nondistended, no suprapubic fullness or tenderness GU: right CVA tenderness, no left CVA tenderness DRE: deferred Extremities: warm and well perfused, no edema Neuro: Appropriate, no focal neurological deficits  Most Recent Labs: Lab Results  Component Value Date   WBC 16.7 (H) 10/24/2017   HGB 13.1 10/24/2017   HCT 40.2 10/24/2017   PLT 278 10/24/2017    Lab Results  Component Value Date   NA 140 10/24/2017   K 4.3 10/24/2017   CL  106 10/24/2017   CO2 24 10/24/2017   BUN 20 10/24/2017   CREATININE 1.50 (H) 10/24/2017   CALCIUM 9.7 10/24/2017    Lab Results  Component Value Date   ALKPHOS 85 05/14/2016   BILITOT 1.0 05/14/2016   PROT 8.1 05/14/2016   ALBUMIN 4.8 05/14/2016   ALT 19 05/14/2016   AST 22 05/14/2016    No results found for: INR, APTT   Urine Culture: Pending    IMAGING: Ct Renal Stone Study  Result Date: 10/24/2017 CLINICAL DATA:  67 year old male with right flank pain radiating to the groin. History of known kidney stone. EXAM: CT ABDOMEN AND PELVIS WITHOUT CONTRAST TECHNIQUE: Multidetector CT imaging of the abdomen and pelvis was performed following the standard protocol without IV contrast. COMPARISON:  Abdominal CT dated 05/14/2016 FINDINGS: Evaluation of this exam is limited in the absence of intravenous contrast. Lower chest: The visualized lung bases are clear. No intra-free air or free fluid. Hepatobiliary: The liver is unremarkable. Cholecystectomy. No intrahepatic biliary ductal dilatation. No calcified stone noted in the central CBD. Pancreas: Unremarkable. No pancreatic ductal dilatation or surrounding inflammatory changes. Spleen: Normal in size without focal abnormality. Adrenals/Urinary Tract: The adrenal glands are unremarkable. Multiple nonobstructing bilateral renal calculi measure up to 4 mm in the upper pole of the right kidney. There is a 5 mm partially obstructing stone in the distal right ureter at the right ureterovesical junction. There is mild right hydronephrosis. A 7 mm distal left ureteral stone approximately 1 cm from the UVJ is seen. There is no hydronephrosis on the left. The urinary bladder is unremarkable. Stomach/Bowel: There is no bowel obstruction or active inflammation. The appendix is normal. Vascular/Lymphatic: Mild aortoiliac atherosclerotic disease. The abdominal aorta and IVC are otherwise grossly unremarkable on this noncontrast CT. No portal venous gas. There  is no adenopathy. Reproductive: The prostate and seminal vesicles are grossly unremarkable. Other: None Musculoskeletal: Levoscoliosis of the lower thoracic spine. There is degenerative changes at L5-S1. Small loose bodies noted in the hip joints bilaterally. No acute osseous pathology. IMPRESSION: 1. A 5 mm right UVJ stone with mild right hydronephrosis. A 7 mm distal left ureteral calculus is also seen. There is no hydronephrosis on the left. Multiple additional nonobstructing bilateral renal calculi noted. 2. No bowel obstruction or active inflammation.  Normal appendix. Electronically Signed   By: Anner Crete M.D.   On: 10/24/2017 05:12    ------  Assessment:  Patient is a 67 y.o. male with history of chronic prostatitis and nephrolithiasis presenting with several hour of flank pain found to have bilateral ureteral stones. Though they are not currently obstructing without evidence of hydronephrosis and a creatinine near the patient's baseline, the present of bilateral ureteral stones is concerning that they could obstruct and cause renal failure. As such, after discussing  possible options with patient, we will proceed with cystourethroscopy and bilateral ureteral stent placement. Risks and benefits of procedure discussed, including risk of stent discomfort. Patient in agreement, and would like to get stone finally treated by Dr. Amalia Hailey his long time urologist.  Recommendations: - Admit to urology service - strain all urine - flomax - NPO for OR - posted, consented for cystourethroscopy, bilateral ureteral stent placement, bilateral retrograde pyelogram. Due to lack of infectious signs and symptoms, patient may discharge after procedure   Thank you for this consult. Please contact the urology consult pager with any further questions/concerns.  Jonna Clark, MD Urology Surgical Resident  -----

## 2017-10-24 NOTE — Discharge Summary (Signed)
Date of admission: 10/24/2017  Date of discharge: 10/24/2017  Admission diagnosis: Bilateral ureteral stones  Discharge diagnosis: Same  Secondary diagnoses:  Patient Active Problem List   Diagnosis Date Noted  . Bilateral kidney stones 10/24/2017    History and Physical: For full details, please see admission history and physical. Briefly, Patrick Noble is a 67 y.o. year old patient admitted with bilateral ureteral stones and right flank pain.   Hospital Course: Patient tolerated the procedure well.  He was then transferred to the floor after an uneventful PACU stay.  His hospital course was uncomplicated.  However, upon waiting for emergent bilateral ureteral stent placement for bilateral obstruction, the patient passed his right ureteral stone and pain resolved.   He will follow up with his regular urologist, Dr. Amalia Hailey, to discuss left ESWL as his right kidney is no longer obstructed.   Laboratory values:  Recent Labs    10/24/17 0509  WBC 16.7*  HGB 13.1  HCT 40.2   Recent Labs    10/24/17 0509  NA 140  K 4.3  CL 106  CO2 24  GLUCOSE 159*  BUN 20  CREATININE 1.50*  CALCIUM 9.7   No results for input(s): LABPT, INR in the last 72 hours. No results for input(s): LABURIN in the last 72 hours. No results found for this or any previous visit.  Disposition: Home  Discharge instruction: The patient was instructed to be ambulatory but told to refrain from heavy lifting, strenuous activity, or driving.   Discharge medications:  Allergies as of 10/24/2017   No Known Allergies     Medication List    TAKE these medications   acetaminophen 500 MG tablet Commonly known as:  TYLENOL Take 1,000 mg by mouth every 6 (six) hours as needed for mild pain, moderate pain or headache.   dutasteride 0.5 MG capsule Commonly known as:  AVODART Take 0.5 mg by mouth daily.   HM VITAMIN D3 4000 units Caps Generic drug:  Cholecalciferol Take 4,000 Units by mouth daily.    HYDROcodone-acetaminophen 5-325 MG tablet Commonly known as:  NORCO/VICODIN Take 0.5-1 tablets by mouth every 6 (six) hours as needed for moderate pain.   lisinopril 10 MG tablet Commonly known as:  PRINIVIL,ZESTRIL Take 10 mg by mouth daily.   loratadine 10 MG tablet Commonly known as:  CLARITIN Take 10 mg by mouth daily as needed for allergies.   omega-3 acid ethyl esters 1 g capsule Commonly known as:  LOVAZA Take 1 g by mouth daily.   PARoxetine 40 MG tablet Commonly known as:  PAXIL Take 40 mg by mouth at bedtime.   PRESERVISION AREDS 2 PO Take 1 tablet by mouth 2 (two) times daily.       Followup:  Follow-up Information    Domingo Pulse, MD Follow up.   Specialty:  Urology Why:  As scheduled Contact information: [address] [place] [address] [phone]

## 2017-10-24 NOTE — ED Notes (Signed)
Per Dr. Louis Meckel, patient is able to be discharged from the ED after passing kidney stone.

## 2017-10-24 NOTE — Discharge Summary (Signed)
Date of admission: 10/24/2017  Date of discharge: 10/24/2017  Admission diagnosis: bilateral obstruction stone  Discharge diagnosis: same  Secondary diagnoses:  Patient Active Problem List   Diagnosis Date Noted  . Bilateral kidney stones 10/24/2017    Procedures performed: Procedure(s): CYSTOSCOPY WITH RETROGRADE PYELOGRAM/URETERAL STENT PLACEMENT  History and Physical: For full details, please see admission history and physical. Briefly, Patrick Noble is a 67 y.o. year old patient with bilateral obstructing ureteral stones.   Hospital Course:  In the ED the patient passed his right distal ureteral stone.  He was feeling better and was discharged home.  He is scheduled to follow-up with Dr. Amalia Hailey tomorrow for discussion treatment of his left distal stone.  Laboratory values:  Recent Labs    10/24/17 0509  WBC 16.7*  HGB 13.1  HCT 40.2   Recent Labs    10/24/17 0509  NA 140  K 4.3  CL 106  CO2 24  GLUCOSE 159*  BUN 20  CREATININE 1.50*  CALCIUM 9.7   No results for input(s): LABPT, INR in the last 72 hours. No results for input(s): LABURIN in the last 72 hours. No results found for this or any previous visit.  Disposition: Home  Discharge instruction: The patient was instructed to be ambulatory but told to refrain from heavy lifting, strenuous activity, or driving.   Discharge medications:  Allergies as of 10/24/2017   No Known Allergies     Medication List    TAKE these medications   acetaminophen 500 MG tablet Commonly known as:  TYLENOL Take 1,000 mg by mouth every 6 (six) hours as needed for mild pain, moderate pain or headache.   dutasteride 0.5 MG capsule Commonly known as:  AVODART Take 0.5 mg by mouth daily.   HM VITAMIN D3 4000 units Caps Generic drug:  Cholecalciferol Take 4,000 Units by mouth daily.   HYDROcodone-acetaminophen 5-325 MG tablet Commonly known as:  NORCO/VICODIN Take 0.5-1 tablets by mouth every 6 (six) hours as needed for  moderate pain.   lisinopril 10 MG tablet Commonly known as:  PRINIVIL,ZESTRIL Take 10 mg by mouth daily.   loratadine 10 MG tablet Commonly known as:  CLARITIN Take 10 mg by mouth daily as needed for allergies.   omega-3 acid ethyl esters 1 g capsule Commonly known as:  LOVAZA Take 1 g by mouth daily.   PARoxetine 40 MG tablet Commonly known as:  PAXIL Take 40 mg by mouth at bedtime.   PRESERVISION AREDS 2 PO Take 1 tablet by mouth 2 (two) times daily.      Followup:  Follow-up Information    Domingo Pulse, MD Follow up.   Specialty:  Urology Why:  As scheduled Contact information: [place] [address] [phone]

## 2017-10-24 NOTE — Discharge Instructions (Signed)
F/u with Dr. Amalia Hailey as scheduled tomorrow for treatment of your left ureteral stone.

## 2017-10-24 NOTE — ED Triage Notes (Signed)
Pt is c/o right flank pain that is radiating to his groin area  Pt states pain started tonight shortly after he went to bed  Pt has nausea without vomiting  Pt states he has known kidney stones one is 84mm and the other is 34mm   Pt has an appt with urology on Tuesday

## 2017-10-24 NOTE — ED Notes (Signed)
Pt reports passing one kidney stone. This RN will notify Dr. Louis Meckel of [facility].

## 2017-10-24 NOTE — ED Provider Notes (Signed)
Pt evaluated by urology. He actually subsequently passed one of the stones in the ED though. To be discharged with urology FU.    Virgel Manifold, MD 10/24/17 [phone]

## 2017-10-24 NOTE — ED Provider Notes (Signed)
[facility] DEPT Provider Note   CSN: [id number] Arrival date & time: 10/24/17  0351  Time seen 4:20 AM   History   Chief Complaint Chief Complaint  Patient presents with  . Flank Pain    HPI Patrick Noble is a 67 y.o. male.  HPI patient states he has a history of kidney stones which he has passed on his own and he also has had cystoscopy laser removal.  He states in October 2017 he had a scan which showed he had a stone in each kidney that were both large, one was 7 mm and the other  8 mm.  He states he has chronic prostatitis and happens to have a follow-up appointment with Dr. Amalia Hailey, urology tomorrow, January 15.  He states about 11 PM as he was getting ready for bed he started having right flank pain that now is radiating into his right groin.  He has had nausea without vomiting, he denies gross hematuria, and he denies the need or urge to urinate and cannot.  He states this feels like in the past when he has passed his kidney stones before.  PCP Hulan Fess, MD Urology Dr Amalia Hailey  Past Medical History:  Diagnosis Date  . Degenerative arthritis of lumbar spine   . Hypertension   . Kidney stone   . Prostate pain   . Scoliosis     Patient Active Problem List   Diagnosis Date Noted  . Bilateral kidney stones 10/24/2017    Past Surgical History:  Procedure Laterality Date  . CHOLECYSTECTOMY    . KNEE ARTHROSCOPY         Home Medications    Prior to Admission medications   Medication Sig Start Date End Date Taking? Authorizing Provider  acetaminophen (TYLENOL) 500 MG tablet Take 1,000 mg by mouth every 6 (six) hours as needed for mild pain, moderate pain or headache.    [provider]  benzonatate (TESSALON) 100 MG capsule Take 100 mg by mouth 3 (three) times daily as needed for cough.    [provider]  Cholecalciferol (HM VITAMIN D3) 4000 units CAPS Take 4,000 Units by mouth daily.    [provider]    dutasteride (AVODART) 0.5 MG capsule Take 0.5 mg by mouth daily.      [provider]  fluticasone (FLONASE) 50 MCG/ACT nasal spray Place 2 sprays into both nostrils daily.    [provider]  HYDROcodone-acetaminophen (NORCO/VICODIN) 5-325 MG tablet Take 1 tablet by mouth every 6 (six) hours as needed for moderate pain.    [provider]  ketorolac (TORADOL) 10 MG tablet Take 1 tablet (10 mg total) by mouth every 6 (six) hours as needed. 05/14/16   Hedges, Dellis Filbert, PA-C  lisinopril (PRINIVIL,ZESTRIL) 10 MG tablet Take 10 mg by mouth daily.      [provider]  loratadine (CLARITIN) 10 MG tablet Take 10 mg by mouth daily.    [provider]  Multiple Vitamin (MULTIVITAMIN WITH MINERALS) TABS tablet Take 1 tablet by mouth daily.    [provider]  omega-3 acid ethyl esters (LOVAZA) 1 g capsule Take 2 g by mouth daily.    [provider]  OVER THE COUNTER MEDICATION Take 1 capsule by mouth daily. Medication:  Prosta-Q    [provider]  PARoxetine (PAXIL) 40 MG tablet Take 40 mg by mouth at bedtime.     [provider]    Family History History reviewed. No  pertinent family history.  Social History Social History   Tobacco Use  . Smoking status: Former Research scientist (life sciences)  . Smokeless tobacco: Never Used  Substance Use Topics  . Alcohol use: Yes  . Drug use: No     Allergies   Patient has no known allergies.   Review of Systems Review of Systems  All other systems reviewed and are negative.    Physical Exam Updated Vital Signs BP (!) 178/102   Pulse 91   Temp 98.1 F (36.7 C) (Oral)   Resp 18   SpO2 98%   Vital signs normal except hypertension   Physical Exam  Constitutional: He is oriented to person, place, and time. He appears well-developed and well-nourished.  Non-toxic appearance. He does not appear ill. He appears distressed.  HENT:  Head: Normocephalic and atraumatic.  Right Ear: External  ear normal.  Left Ear: External ear normal.  Nose: Nose normal. No mucosal edema or rhinorrhea.  Mouth/Throat: Oropharynx is clear and moist and mucous membranes are normal. No dental abscesses or uvula swelling.  Eyes: Conjunctivae and EOM are normal. Pupils are equal, round, and reactive to light.  Neck: Normal range of motion and full passive range of motion without pain. Neck supple.  Cardiovascular: Normal rate, regular rhythm and normal heart sounds. Exam reveals no gallop and no friction rub.  No murmur heard. Pulmonary/Chest: Effort normal and breath sounds normal. No respiratory distress. He has no wheezes. He has no rhonchi. He has no rales. He exhibits no tenderness and no crepitus.  Abdominal: Soft. Normal appearance and bowel sounds are normal. He exhibits no distension. There is no tenderness. There is no rebound and no guarding.  Right CVAT  Musculoskeletal: Normal range of motion. He exhibits no edema or tenderness.  Moves all extremities well.   Neurological: He is alert and oriented to person, place, and time. He has normal strength. No cranial nerve deficit.  Skin: Skin is warm, dry and intact. No rash noted. No erythema. No pallor.  Psychiatric: He has a normal mood and affect. His speech is normal and behavior is normal. His mood appears not anxious.  Nursing note and vitals reviewed.    ED Treatments / Results  Labs (all labs ordered are listed, but only abnormal results are displayed) Results for orders placed or performed during the hospital encounter of 51/76/16  Basic metabolic panel  Result Value Ref Range   Sodium 140 135 - 145 mmol/L   Potassium 4.3 3.5 - 5.1 mmol/L   Chloride 106 101 - 111 mmol/L   CO2 24 22 - 32 mmol/L   Glucose, Bld 159 (H) 65 - 99 mg/dL   BUN 20 6 - 20 mg/dL   Creatinine, Ser 1.50 (H) 0.61 - 1.24 mg/dL   Calcium 9.7 8.9 - 10.3 mg/dL   GFR calc non Af Amer 47 (L) >60 mL/min   GFR calc Af Amer 55 (L) >60 mL/min   Anion gap 10 5 - 15    CBC with Differential  Result Value Ref Range   WBC 16.7 (H) 4.0 - 10.5 K/uL   RBC 6.07 (H) 4.22 - 5.81 MIL/uL   Hemoglobin 13.1 13.0 - 17.0 g/dL   HCT 40.2 39.0 - 52.0 %   MCV 66.2 (L) 78.0 - 100.0 fL   MCH 21.6 (L) 26.0 - 34.0 pg   MCHC 32.6 30.0 - 36.0 g/dL   RDW 14.5 11.5 - 15.5 %   Platelets 278 150 - 400 K/uL   Neutrophils  Relative % 89 %   Lymphocytes Relative 7 %   Monocytes Relative 4 %   Eosinophils Relative 0 %   Basophils Relative 0 %   Neutro Abs 14.8 (H) 1.7 - 7.7 K/uL   Lymphs Abs 1.2 0.7 - 4.0 K/uL   Monocytes Absolute 0.7 0.1 - 1.0 K/uL   Eosinophils Absolute 0.0 0.0 - 0.7 K/uL   Basophils Absolute 0.0 0.0 - 0.1 K/uL   WBC Morphology WHITE COUNT CONFIRMED ON SMEAR   Urinalysis, Routine w reflex microscopic  Result Value Ref Range   Color, Urine YELLOW YELLOW   APPearance CLEAR CLEAR   Specific Gravity, Urine 1.021 1.005 - 1.030   pH 5.0 5.0 - 8.0   Glucose, UA NEGATIVE NEGATIVE mg/dL   Hgb urine dipstick NEGATIVE NEGATIVE   Bilirubin Urine NEGATIVE NEGATIVE   Ketones, ur NEGATIVE NEGATIVE mg/dL   Protein, ur NEGATIVE NEGATIVE mg/dL   Nitrite NEGATIVE NEGATIVE   Leukocytes, UA NEGATIVE NEGATIVE   Laboratory interpretation all normal except leukocytosis, renal insufficiency (creat was 1.7 in Aug 2017)    EKG  EKG Interpretation None       Radiology Ct Renal Stone Study  Result Date: 10/24/2017 CLINICAL DATA:  67 year old male with right flank pain radiating to the groin. History of known kidney stone. EXAM: CT ABDOMEN AND PELVIS WITHOUT CONTRAST TECHNIQUE: Multidetector CT imaging of the abdomen and pelvis was performed following the standard protocol without IV contrast. COMPARISON:  Abdominal CT dated 05/14/2016 FINDINGS: Evaluation of this exam is limited in the absence of intravenous contrast. Lower chest: The visualized lung bases are clear. No intra-free air or free fluid. Hepatobiliary: The liver is unremarkable. Cholecystectomy. No  intrahepatic biliary ductal dilatation. No calcified stone noted in the central CBD. Pancreas: Unremarkable. No pancreatic ductal dilatation or surrounding inflammatory changes. Spleen: Normal in size without focal abnormality. Adrenals/Urinary Tract: The adrenal glands are unremarkable. Multiple nonobstructing bilateral renal calculi measure up to 4 mm in the upper pole of the right kidney. There is a 5 mm partially obstructing stone in the distal right ureter at the right ureterovesical junction. There is mild right hydronephrosis. A 7 mm distal left ureteral stone approximately 1 cm from the UVJ is seen. There is no hydronephrosis on the left. The urinary bladder is unremarkable. Stomach/Bowel: There is no bowel obstruction or active inflammation. The appendix is normal. Vascular/Lymphatic: Mild aortoiliac atherosclerotic disease. The abdominal aorta and IVC are otherwise grossly unremarkable on this noncontrast CT. No portal venous gas. There is no adenopathy. Reproductive: The prostate and seminal vesicles are grossly unremarkable. Other: None Musculoskeletal: Levoscoliosis of the lower thoracic spine. There is degenerative changes at L5-S1. Small loose bodies noted in the hip joints bilaterally. No acute osseous pathology. IMPRESSION: 1. A 5 mm right UVJ stone with mild right hydronephrosis. A 7 mm distal left ureteral calculus is also seen. There is no hydronephrosis on the left. Multiple additional nonobstructing bilateral renal calculi noted. 2. No bowel obstruction or active inflammation.  Normal appendix. Electronically Signed   By: Anner Crete M.D.   On: 10/24/2017 05:12    Procedures Procedures (including critical care time)  Medications Ordered in ED Medications  tamsulosin (FLOMAX) capsule 0.4 mg (not administered)  sodium chloride flush (NS) 0.9 % injection 3 mL (not administered)  dextrose 5 %-0.45 % sodium chloride infusion (not administered)  acetaminophen (TYLENOL) tablet 650 mg  (not administered)    Or  acetaminophen (TYLENOL) suppository 650 mg (not administered)  oxyCODONE (Oxy IR/ROXICODONE)  immediate release tablet 5-10 mg (not administered)  docusate sodium (COLACE) capsule 100 mg (not administered)  senna-docusate (Senokot-S) tablet 1 tablet (not administered)  ondansetron (ZOFRAN) tablet 4 mg (not administered)    Or  ondansetron (ZOFRAN) injection 4 mg (not administered)  morphine 2 MG/ML injection 2 mg (not administered)  fentaNYL (SUBLIMAZE) injection 50 mcg (50 mcg Intravenous Given 10/24/17 0513)  ondansetron (ZOFRAN) injection 4 mg (4 mg Intravenous Given 10/24/17 0512)     Initial Impression / Assessment and Plan / ED Course  I have reviewed the triage vital signs and the nursing notes.  Pertinent labs & imaging results that were available during my care of the patient were reviewed by me and considered in my medical decision making (see chart for details).     When I reviewed patient's last blood work in October 2017 he did have a renal insufficiency with a creatinine of 1.7 and GFR of 41.  Lab work was repeated tonight.  He was given IV pain and nausea medication.  Once I see his creatinine if his creatinine is better he will get IV Toradol.  CT renal was done to see first of all if the stone has gotten bigger and where it is located.  Patient was advised as nursing staff if he needed something else for pain.  We discussed patient's CT result, he denies any recent pain in his left flank. We discussed we needed a urine sample. He is going to try now.   06:08 AM Dr Shanon Brow, Urology, will see patient for stent placement, Urology will admit.   Final Clinical Impressions(s) / ED Diagnoses   Final diagnoses:  Left ureteral stone  Right ureteral stone  Renal insufficiency    Plan pt is going to the OR for stent placement.   Rolland Porter, MD, Barbette Or, MD 10/24/17 [phone]

## 2017-10-25 LAB — URINE CULTURE

## 2019-05-03 ENCOUNTER — Other Ambulatory Visit: Payer: Self-pay | Admitting: Family Medicine

## 2019-05-03 DIAGNOSIS — M5416 Radiculopathy, lumbar region: Secondary | ICD-10-CM

## 2019-05-06 ENCOUNTER — Ambulatory Visit (INDEPENDENT_AMBULATORY_CARE_PROVIDER_SITE_OTHER): Payer: 59

## 2019-05-06 ENCOUNTER — Other Ambulatory Visit: Payer: Self-pay

## 2019-05-06 DIAGNOSIS — M5416 Radiculopathy, lumbar region: Secondary | ICD-10-CM

## 2019-05-15 ENCOUNTER — Ambulatory Visit: Payer: Medicare Other | Admitting: Neurology

## 2019-05-29 ENCOUNTER — Ambulatory Visit (INDEPENDENT_AMBULATORY_CARE_PROVIDER_SITE_OTHER): Payer: 59 | Admitting: Neurology

## 2019-05-29 ENCOUNTER — Encounter: Payer: Self-pay | Admitting: Neurology

## 2019-05-29 ENCOUNTER — Other Ambulatory Visit: Payer: Self-pay

## 2019-05-29 VITALS — BP 117/71 | HR 78 | Temp 98.3°F | Ht 77.0 in | Wt 208.8 lb

## 2019-05-29 DIAGNOSIS — G609 Hereditary and idiopathic neuropathy, unspecified: Secondary | ICD-10-CM | POA: Diagnosis not present

## 2019-05-29 DIAGNOSIS — G5731 Lesion of lateral popliteal nerve, right lower limb: Secondary | ICD-10-CM | POA: Diagnosis not present

## 2019-05-29 DIAGNOSIS — M21371 Foot drop, right foot: Secondary | ICD-10-CM

## 2019-05-29 NOTE — Patient Instructions (Addendum)
EMG/NCS  Common Peroneal Nerve Entrapment  Common peroneal nerve entrapment is a condition that can make it hard to lift a foot. The condition results from pressure on a nerve in the lower leg called the common peroneal nerve. Your common peroneal nerve provides feeling to your outer lower leg and foot. It also supplies the muscles that move your foot and toes upward and outward. What are the causes? This condition may be caused by:  Sitting cross-legged, squatting, or kneeling for long periods of time.  A hard, direct hit to the side of the lower leg.  Swelling from a knee injury.  A break (fracture) in one of the lower leg bones.  Wearing a boot or cast that ends just below the knee.  A growth or cyst near the nerve. What increases the risk? This condition is more likely to develop in people who play:  Contact sports, such as football or hockey.  Sports where you wear high and stiff boots, such as skiing. What are the signs or symptoms? Symptoms of this condition include:  Trouble lifting your foot up (foot drop).  Tripping often.  Your foot hitting the ground harder than normal as you walk.  Numbness, tingling, or pain in the outside of the knee, outside of the lower leg, and top of the foot.  Sensitivity to pressure on the front or side of the leg. How is this diagnosed? This condition may be diagnosed based on:  Your symptoms.  Your medical history.  A physical exam.  Tests, such as: ? An X-ray to check the bones of your knee and leg. ? MRI to check tendons that attach to the side of your knee. ? An ultrasound to check for a growth or cyst. ? An electromyogram (EMG) to check your nerves. During your physical exam, your health care provider will check for numbness in your leg and test the strength of your lower leg muscles. He or she may tap the side of your lower leg to see if that causes tingling. How is this treated? Treatment for this condition may  include:  Avoiding activities that make symptoms worse.  Using a brace to hold up your foot and toes.  Taking anti-inflammatory pain medicines to relieve swelling and lessen pain.  Having medicines injected into your ankle joint to lessen pain and swelling.  Doing exercises to help you regain or maintain movement (physical therapy).  Surgery to take pressure off the nerve. This may be needed if there is no improvement after 2-3 months or if there is a growth pushing on the nerve.  Returning gradually to full activity. Follow these instructions at home: If you have a brace:  Wear it as told by your health care provider. Remove it only as told by your health care provider.  Loosen the brace if your toes tingle, become numb, or turn cold and blue.  Keep the brace clean.  If the brace is not waterproof: ? Do not let it get wet. ? Cover it with a watertight covering when you take a bath or a shower.  Ask your health care provider when it is safe to drive with a brace on your foot. Activity  Return to your normal activities as told by your health care provider. Ask your health care provider what activities are safe for you.  Do not do any activities that make pain or swelling worse.  Do exercises as told by your health care provider. General instructions  Take over-the-counter and  prescription medicines only as told by your health care provider.  Do not put your full weight on your knee until your health care provider says you can. Use crutches as directed by your health care provider.  Keep all follow-up visits as told by your health care provider. This is important. How is this prevented?  Wear supportive footwear that is appropriate for your athletic activity.  Avoid athletic activities that cause ankle pain or swelling.  Wear protective padding over your lower legs when playing contact sports.  Make sure your boots do not put extra pressure on the area just below your  knees.  Do not sit cross-legged for long periods of time. Contact a health care provider if:  Your symptoms do not get better in 2-3 months.  The weakness or numbness in your leg or foot gets worse. Summary  Common peroneal nerve entrapment is a condition that results from pressure on a nerve in the lower leg called the common peroneal nerve.  This condition may be caused by a hard hit, swelling, a fracture, or a cyst in the lower leg.  Treatment may include rest, a brace, medicines, and physical therapy. Sometimes surgery is needed.  Do not do any activities that make pain or swelling worse. This information is not intended to replace advice given to you by your health care provider. Make sure you discuss any questions you have with your health care provider. Document Released: 09/27/2005 Document Revised: 08/07/2018 Document Reviewed: 08/07/2018 Elsevier Patient Education  2020 [place] is a test to check how well your muscles and nerves are working. This procedure includes the combined use of electromyogram (EMG) and nerve conduction study (NCS). EMG is used to look for muscular disorders. NCS, which is also called electroneurogram, measures how well your nerves are controlling your muscles. The procedures are usually done together to check if your muscles and nerves are healthy. If the results of the tests are abnormal, this may indicate disease or injury, such as a neuromuscular disease or peripheral nerve damage. Tell a health care provider about:  Any allergies you have.  All medicines you are taking, including vitamins, herbs, eye drops, creams, and over-the-counter medicines.  Any problems you or family members have had with anesthetic medicines.  Any blood disorders you have.  Any surgeries you have had.  Any medical conditions you have.  If you have a pacemaker.  Whether you are pregnant or may be pregnant. What are  the risks? Generally, this is a safe procedure. However, problems may occur, including:  Infection where the electrodes were inserted.  Bleeding. What happens before the procedure? Medicines Ask your health care provider about:  Changing or stopping your regular medicines. This is especially important if you are taking diabetes medicines or blood thinners.  Taking medicines such as aspirin and ibuprofen. These medicines can thin your blood. Do not take these medicines unless your health care provider tells you to take them.  Taking over-the-counter medicines, vitamins, herbs, and supplements. General instructions  Your health care provider may ask you to avoid: ? Beverages that have caffeine, such as coffee and tea. ? Any products that contain nicotine or tobacco. These products include cigarettes, e-cigarettes, and chewing tobacco. If you need help quitting, ask your health care provider.  Do not use lotions or creams on the same day that you will be having the procedure. What happens during the procedure? For EMG   Your health care provider will ask  you to stay in a position so that he or she can access the muscle that will be studied. You may be standing, sitting, or lying down.  You may be given a medicine that numbs the area (local anesthetic).  A very thin needle that has an electrode will be inserted into your muscle.  Another small electrode will be placed on your skin near the muscle.  Your health care provider will ask you to continue to remain still.  The electrodes will send a signal that tells about the electrical activity of your muscles. You may see this on a monitor or hear it in the room.  After your muscles have been studied at rest, your health care provider will ask you to contract or flex your muscles. The electrodes will send a signal that tells about the electrical activity of your muscles.  Your health care provider will remove the electrodes and the  electrode needles when the procedure is finished. The procedure may vary among health care providers and hospitals. For NCS   An electrode that records your nerve activity (recording electrode) will be placed on your skin by the muscle that is being studied.  An electrode that is used as a reference (reference electrode) will be placed near the recording electrode.  A paste or gel will be applied to your skin between the recording electrode and the reference electrode.  Your nerve will be stimulated with a mild shock. Your health care provider will measure how much time it takes for your muscle to react.  Your health care provider will remove the electrodes and the gel when the procedure is finished. The procedure may vary among health care providers and hospitals. What happens after the procedure?  It is up to you to get the results of your procedure. Ask your health care provider, or the department that is doing the procedure, when your results will be ready.  Your health care provider may: ? Give you medicines for any pain. ? Monitor the insertion sites to make sure that bleeding stops. Summary  Electromyoneurogram is a test to check how well your muscles and nerves are working.  If the results of the tests are abnormal, this may indicate disease or injury.  This is a safe procedure. However, problems may occur, such as bleeding and infection.  Your health care provider will do two tests to complete this procedure. One checks your muscles (EMG) and another checks your nerves (NCS).  It is up to you to get the results of your procedure. Ask your health care provider, or the department that is doing the procedure, when your results will be ready. This information is not intended to replace advice given to you by your health care provider. Make sure you discuss any questions you have with your health care provider. Document Released: 01/28/2005 Document Revised: 06/13/2018 Document  Reviewed: 05/26/2018 Elsevier Patient Education  2020 [employer].

## 2019-05-29 NOTE — Progress Notes (Signed)
[facility]    Provider:  Dr Jaynee Eagles Requesting Provider: Hulan Fess, MD Primary Care Provider:  Hulan Fess, MD  CC:  Foot Drop  HPI:  Patrick Noble. is a 68 y.o. male here as requested by Hulan Fess, MD for foot drop.  Past medical history of scoliosis, kidney stone, hypertension, degenerative arthritis of the lumbar spine, essential hypertension, depression, cervical degenerative disc disease, GERD, anxiety, obstructive sleep apnea on CPAP. [name]  MRI of the lumbar spine was recently completed a few weeks ago which was stable compared to last MRI lumbar spine in 2018 and did not show obvious etiology for his weakness. 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  His job is not physical. He has degenerative disk in the L5, he has had injections Dr. Glenna Fellows and a chiropractor. He has cold and numb feet, the right is moreso. In his daily walks his right foot would flop, several months ago, weakness of dorsiflexion, the top feels a little different. No inciting events. This happened while home on Covid, he does cross his legs a lot but he has always done that, no significant weight loss. Not worsening or getting better. If he doesn't pick his foot up he can stub his toes. Not picking up his feet. Hear it slapping. He denies any recent sciatica. But he does feel pressure in the right leg from the low back into the leg if he is laying for long periods of time, walking is fine walks a mile in the morning.   Reviewed notes, labs and imaging from outside physicians, which showed:  I reviewed MRI of the lumbar spine May 06, 2019.  I reviewed images and agree with report below:Disc levels:  T12- L1: Minor spondylosis  L1-L2: Mild spondylosis and annulus bulging.  L2-L3: Mild spondylosis and rightward annulus bulging.  L3-L4: Mild disc bulging.  L4-L5: Mild disc bulging.  Minimal facet spurring  L5-S1:Focal advanced disc narrowing with  endplate degeneration and a chronic central protrusion. Negative facets. The foramina are narrowed but there is no compressive stenosis.  IMPRESSION: 1. Stable compared to 2018.  No L5 compression to explain foot drop. 2. L5-S1 focal disc degeneration.  I reviewed Dr. Unk Pinto notes.  Patient seems to be describing symptoms of right foot drop, with his history of lumbar spondylosis especially L5-S1 and MRI of the lumbar spine was ordered which did not show cause for right foot drop symptoms, he was referred here.  Patient was seen in the office for foot drop on May 02, 2019, he has a history of lumbar spondylosis and for several years until 2011 was seen by neurosurgeon Dr. Glenna Fellows with a diagnosis of L5-S1 radiculopathy, symptoms started about a month prior to the appointment, when he is walking his right foot slaps down hard at times it feels like he has a foot drop, feels weaker compared to the left foot, has tingling in both feet bilaterally from time to time and a cold sensation in both feet intermittently.  Hemoglobin A1c 5.4 in August 2019, CMP was unremarkable August 2019, CBC with differential showed anemia (history of anemia secondary to thalassemia trait).  Review of Systems: Patient complains of symptoms per HPI as well as the following symptoms: numbness in feet. Pertinent negatives and positives per HPI. All others negative.   Social History   Socioeconomic History  . Marital status: Married    Spouse name: Not on file  . Number of children:  Not on file  . Years of education: Not on file  . Highest education level: Not on file  Occupational History  . Not on file  Social Needs  . Financial resource strain: Not on file  . Food insecurity    Worry: Not on file    Inability: Not on file  . Transportation needs    Medical: Not on file    Non-medical: Not on file  Tobacco Use  . Smoking status: Former Research scientist (life sciences)  . Smokeless tobacco: Never Used  Substance and Sexual  Activity  . Alcohol use: Yes  . Drug use: No  . Sexual activity: Not on file  Lifestyle  . Physical activity    Days per week: Not on file    Minutes per session: Not on file  . Stress: Not on file  Relationships  . Social Herbalist on phone: Not on file    Gets together: Not on file    Attends religious service: Not on file    Active member of club or organization: Not on file    Attends meetings of clubs or organizations: Not on file    Relationship status: Not on file  . Intimate partner violence    Fear of current or ex partner: Not on file    Emotionally abused: Not on file    Physically abused: Not on file    Forced sexual activity: Not on file  Other Topics Concern  . Not on file  Social History Narrative  . Not on file    History reviewed. No pertinent family history.  Past Medical History:  Diagnosis Date  . Degenerative arthritis of lumbar spine   . Hypertension   . Kidney stone   . Prostate pain   . Scoliosis     Patient Active Problem List   Diagnosis Date Noted  . Neuropathy of right peroneal nerve 05/29/2019  . Bilateral kidney stones 10/24/2017    Past Surgical History:  Procedure Laterality Date  . CHOLECYSTECTOMY    . injections in the back    . KNEE ARTHROSCOPY      Current Outpatient Medications  Medication Sig Dispense Refill  . acetaminophen (TYLENOL) 325 MG tablet Take by mouth.    . Cholecalciferol 50 MCG (2000 UT) TABS Take by mouth.    . clonazePAM (KLONOPIN) 0.5 MG tablet clonazepam 0.5 mg tablet    . HYDROcodone-acetaminophen (NORCO/VICODIN) 5-325 MG tablet Take 0.5-1 tablets by mouth every 6 (six) hours as needed for moderate pain.     [name] lisinopril (PRINIVIL,ZESTRIL) 10 MG tablet Take 10 mg by mouth daily.      [name] loratadine (CLARITIN) 10 MG tablet Take 10 mg by mouth daily as needed for allergies.     . Multiple Vitamins-Minerals (PRESERVISION AREDS 2 PO) Take 1 tablet by mouth 2 (two) times daily.    [name] omega-3 acid  ethyl esters (LOVAZA) 1 g capsule Take 1 g by mouth daily.     [name] PARoxetine (PAXIL) 40 MG tablet Take 40 mg by mouth at bedtime.      No current facility-administered medications for this visit.     Allergies as of 05/29/2019  . (No Known Allergies)    Vitals: BP 117/71   Pulse 78   Temp 98.3 F (36.8 C)   Ht _0  (1.956 m)   Wt 208 lb 12.8 oz (94.7 kg)   BMI 24.76 kg/m  Last Weight:  Wt Readings from Last 1 Encounters:  05/29/19 208 lb 12.8 oz (94.7 kg)   Last Height:   Ht Readings from Last 1 Encounters:  05/29/19 _0  (1.956 m)     Physical exam: Exam: Gen: NAD, conversant, well nourised, obese, well groomed                     CV: RRR, no MRG. No Carotid Bruits. No peripheral edema, warm, nontender Eyes: Conjunctivae clear without exudates or hemorrhage  Neuro: Detailed Neurologic Exam  Speech:    Speech is normal; fluent and spontaneous with normal comprehension.  Cognition:    The patient is oriented to person, place, and time;     recent and remote memory intact;     language fluent;     normal attention, concentration,     fund of knowledge Cranial Nerves:    The pupils are equal, round, and reactive to light. The fundi are flat. Visual fields are full to finger confrontation. Extraocular movements are intact. Trigeminal sensation is intact and the muscles of mastication are normal. The face is symmetric. The palate elevates in the midline. Hearing intact. Voice is normal. Shoulder shrug is normal. The tongue has normal motion without fasciculations.   Coordination:    Normal finger to nose and heel to shin. Normal rapid alternating movements.   Gait:    Heel-toe and tandem gait are normal.   Motor Observation:    No asymmetry, no atrophy, and no involuntary movements noted. Tone:    Normal muscle tone.    Posture:    Posture is normal. normal erect    Strength: right leg flexion 4+/5, dorsiflexion 3/5, foot inversion intact, foot eversion  weakness. Otherwise strength is V/V in the upper and lower limbs.      Sensation: decrease distally in a stocking pattern to pin prick. Also right leg decrease lateral lower leg and dorsum of foot.      Reflex Exam:  DTR's:    Deep tendon reflexes in the upper and lower extremities are normal bilaterally. Symmetrical. Ajs present at 2+ bilat.    Toes:    The toes are downgoing bilaterally.   Clonus:    Clonus is absent.    Assessment/Plan:  Really nice 68 year old with weakness of right dorsiflexion and foot eversion. Foot inversion and right AJ normal so suspect more peroneal neuropathy as opposed to sciatic or lumbar radic however cannot rule this out. He has some mild distal neuropathy but I would think not severe enough to cause a foot drop.   Emg/ncs  Xray right knee - most common location of entrapment Conservative measures: stop crossing legs or any position that may cause compression. He sleeps on his right side ensure no compression at the fibular head Will order labs for causes of neuropathy (hgba1c normal) He declines PT at this time Fall precautions, discussed. Not bad enough for prosthesis can consider in future.  Orders Placed This Encounter  Procedures  . DG Knee 3 Views Right  . B12 and Folate Panel  . Methylmalonic acid, serum  . Vitamin B1  . Heavy metals, blood  . Vitamin B6  . Multiple Myeloma Panel (SPEP&IFE w/QIG)  . NCV with EMG(electromyography)    Cc: Hulan Fess, MD  Sarina Ill, MD  [facility] [address] [place], [place] [address]  Phone [phone] Fax [phone]

## 2019-07-12 ENCOUNTER — Encounter: Payer: Medicare Other | Admitting: Neurology

## 2019-08-23 ENCOUNTER — Encounter: Payer: Medicare Other | Admitting: Neurology

## 2019-10-31 ENCOUNTER — Ambulatory Visit: Payer: Self-pay | Attending: Internal Medicine

## 2019-10-31 DIAGNOSIS — Z23 Encounter for immunization: Secondary | ICD-10-CM | POA: Insufficient documentation

## 2019-10-31 NOTE — Progress Notes (Signed)
   [id number]  Name:  Patrick Noble.    MRN: [id number] DOB: 1951-03-25  10/31/2019  Mr. Patrick Noble was observed post Covid-19 immunization for 15 minutes without incidence. He was provided with Vaccine Information Sheet and instruction to access the V-Safe system.   Patrick Noble was instructed to call 911 with any severe reactions post vaccine: [name] Difficulty breathing  . Swelling of your face and throat  . A fast heartbeat  . A bad rash all over your body  . Dizziness and weakness    Immunizations Administered    Name Date Dose VIS Date Route   Pfizer COVID-19 Vaccine 10/31/2019  4:18 PM 0.3 mL 09/21/2019 Intramuscular   Manufacturer: [place]   Lot: [id number]   [place]: [id number]

## 2019-11-18 ENCOUNTER — Ambulatory Visit: Payer: Self-pay | Attending: Internal Medicine

## 2019-11-18 DIAGNOSIS — Z23 Encounter for immunization: Secondary | ICD-10-CM | POA: Insufficient documentation

## 2019-11-18 NOTE — Progress Notes (Signed)
   [id number]  Name:  Royal Kwiecien.    MRN: [id number] DOB: 10/06/1951  11/18/2019  Mr. Yara was observed post Covid-19 immunization for 15 minutes without incidence. He was provided with Vaccine Information Sheet and instruction to access the V-Safe system.   Mr. Allred was instructed to call 911 with any severe reactions post vaccine: [name] Difficulty breathing  . Swelling of your face and throat  . A fast heartbeat  . A bad rash all over your body  . Dizziness and weakness    Immunizations Administered    Name Date Dose VIS Date Route   Pfizer COVID-19 Vaccine 11/18/2019  9:01 AM 0.3 mL 09/21/2019 Intramuscular   Manufacturer: [place]   Lot: [id number]   [place]: [id number]

## 2020-03-18 DIAGNOSIS — R0981 Nasal congestion: Secondary | ICD-10-CM | POA: Diagnosis not present

## 2020-03-18 DIAGNOSIS — R05 Cough: Secondary | ICD-10-CM | POA: Diagnosis not present

## 2020-03-19 ENCOUNTER — Emergency Department (HOSPITAL_COMMUNITY)
Admission: EM | Admit: 2020-03-19 | Discharge: 2020-03-20 | Disposition: A | Discharge status: Home / Self Care | Payer: PPO | Attending: Emergency Medicine | Admitting: Emergency Medicine

## 2020-03-19 ENCOUNTER — Other Ambulatory Visit: Payer: Self-pay

## 2020-03-19 ENCOUNTER — Emergency Department (HOSPITAL_COMMUNITY): Payer: PPO

## 2020-03-19 ENCOUNTER — Encounter (HOSPITAL_COMMUNITY): Payer: Self-pay | Admitting: Emergency Medicine

## 2020-03-19 DIAGNOSIS — R0981 Nasal congestion: Secondary | ICD-10-CM | POA: Diagnosis not present

## 2020-03-19 DIAGNOSIS — R509 Fever, unspecified: Secondary | ICD-10-CM | POA: Diagnosis not present

## 2020-03-19 DIAGNOSIS — Z5321 Procedure and treatment not carried out due to patient leaving prior to being seen by health care provider: Secondary | ICD-10-CM | POA: Insufficient documentation

## 2020-03-19 DIAGNOSIS — R05 Cough: Secondary | ICD-10-CM | POA: Insufficient documentation

## 2020-03-19 DIAGNOSIS — J984 Other disorders of lung: Secondary | ICD-10-CM | POA: Diagnosis not present

## 2020-03-19 NOTE — ED Triage Notes (Signed)
Patient c/o fever, cough, and congestion x4 days. Denies chest pain and SOB. Denies N/V/D.

## 2020-04-25 DIAGNOSIS — M25569 Pain in unspecified knee: Secondary | ICD-10-CM | POA: Diagnosis not present

## 2020-04-25 DIAGNOSIS — I951 Orthostatic hypotension: Secondary | ICD-10-CM | POA: Diagnosis not present

## 2020-05-23 DIAGNOSIS — M25562 Pain in left knee: Secondary | ICD-10-CM | POA: Diagnosis not present

## 2020-05-23 DIAGNOSIS — M222X1 Patellofemoral disorders, right knee: Secondary | ICD-10-CM | POA: Diagnosis not present

## 2020-05-23 DIAGNOSIS — M25561 Pain in right knee: Secondary | ICD-10-CM | POA: Diagnosis not present

## 2020-05-26 DIAGNOSIS — H40023 Open angle with borderline findings, high risk, bilateral: Secondary | ICD-10-CM | POA: Diagnosis not present

## 2020-06-03 DIAGNOSIS — G4733 Obstructive sleep apnea (adult) (pediatric): Secondary | ICD-10-CM | POA: Diagnosis not present

## 2020-06-05 DIAGNOSIS — M9901 Segmental and somatic dysfunction of cervical region: Secondary | ICD-10-CM | POA: Diagnosis not present

## 2020-06-05 DIAGNOSIS — M41124 Adolescent idiopathic scoliosis, thoracic region: Secondary | ICD-10-CM | POA: Diagnosis not present

## 2020-06-05 DIAGNOSIS — M41126 Adolescent idiopathic scoliosis, lumbar region: Secondary | ICD-10-CM | POA: Diagnosis not present

## 2020-06-05 DIAGNOSIS — M9902 Segmental and somatic dysfunction of thoracic region: Secondary | ICD-10-CM | POA: Diagnosis not present

## 2020-06-09 DIAGNOSIS — M9901 Segmental and somatic dysfunction of cervical region: Secondary | ICD-10-CM | POA: Diagnosis not present

## 2020-06-09 DIAGNOSIS — M41124 Adolescent idiopathic scoliosis, thoracic region: Secondary | ICD-10-CM | POA: Diagnosis not present

## 2020-06-09 DIAGNOSIS — M9902 Segmental and somatic dysfunction of thoracic region: Secondary | ICD-10-CM | POA: Diagnosis not present

## 2020-06-09 DIAGNOSIS — M41126 Adolescent idiopathic scoliosis, lumbar region: Secondary | ICD-10-CM | POA: Diagnosis not present

## 2020-06-12 DIAGNOSIS — M9905 Segmental and somatic dysfunction of pelvic region: Secondary | ICD-10-CM | POA: Diagnosis not present

## 2020-06-12 DIAGNOSIS — M9901 Segmental and somatic dysfunction of cervical region: Secondary | ICD-10-CM | POA: Diagnosis not present

## 2020-06-12 DIAGNOSIS — M41124 Adolescent idiopathic scoliosis, thoracic region: Secondary | ICD-10-CM | POA: Diagnosis not present

## 2020-06-12 DIAGNOSIS — M62830 Muscle spasm of back: Secondary | ICD-10-CM | POA: Diagnosis not present

## 2020-06-12 DIAGNOSIS — M41126 Adolescent idiopathic scoliosis, lumbar region: Secondary | ICD-10-CM | POA: Diagnosis not present

## 2020-06-12 DIAGNOSIS — M9902 Segmental and somatic dysfunction of thoracic region: Secondary | ICD-10-CM | POA: Diagnosis not present

## 2020-06-12 DIAGNOSIS — M9903 Segmental and somatic dysfunction of lumbar region: Secondary | ICD-10-CM | POA: Diagnosis not present

## 2020-06-12 DIAGNOSIS — M545 Low back pain: Secondary | ICD-10-CM | POA: Diagnosis not present

## 2020-06-17 DIAGNOSIS — M41126 Adolescent idiopathic scoliosis, lumbar region: Secondary | ICD-10-CM | POA: Diagnosis not present

## 2020-06-17 DIAGNOSIS — M9902 Segmental and somatic dysfunction of thoracic region: Secondary | ICD-10-CM | POA: Diagnosis not present

## 2020-06-17 DIAGNOSIS — M9905 Segmental and somatic dysfunction of pelvic region: Secondary | ICD-10-CM | POA: Diagnosis not present

## 2020-06-17 DIAGNOSIS — M41124 Adolescent idiopathic scoliosis, thoracic region: Secondary | ICD-10-CM | POA: Diagnosis not present

## 2020-06-17 DIAGNOSIS — M9903 Segmental and somatic dysfunction of lumbar region: Secondary | ICD-10-CM | POA: Diagnosis not present

## 2020-06-17 DIAGNOSIS — M545 Low back pain: Secondary | ICD-10-CM | POA: Diagnosis not present

## 2020-06-17 DIAGNOSIS — M62830 Muscle spasm of back: Secondary | ICD-10-CM | POA: Diagnosis not present

## 2020-06-17 DIAGNOSIS — M9901 Segmental and somatic dysfunction of cervical region: Secondary | ICD-10-CM | POA: Diagnosis not present

## 2020-06-20 DIAGNOSIS — M41126 Adolescent idiopathic scoliosis, lumbar region: Secondary | ICD-10-CM | POA: Diagnosis not present

## 2020-06-20 DIAGNOSIS — M41124 Adolescent idiopathic scoliosis, thoracic region: Secondary | ICD-10-CM | POA: Diagnosis not present

## 2020-06-20 DIAGNOSIS — M9902 Segmental and somatic dysfunction of thoracic region: Secondary | ICD-10-CM | POA: Diagnosis not present

## 2020-06-20 DIAGNOSIS — M62830 Muscle spasm of back: Secondary | ICD-10-CM | POA: Diagnosis not present

## 2020-06-20 DIAGNOSIS — M9903 Segmental and somatic dysfunction of lumbar region: Secondary | ICD-10-CM | POA: Diagnosis not present

## 2020-06-20 DIAGNOSIS — M545 Low back pain: Secondary | ICD-10-CM | POA: Diagnosis not present

## 2020-06-20 DIAGNOSIS — M9905 Segmental and somatic dysfunction of pelvic region: Secondary | ICD-10-CM | POA: Diagnosis not present

## 2020-06-20 DIAGNOSIS — M9901 Segmental and somatic dysfunction of cervical region: Secondary | ICD-10-CM | POA: Diagnosis not present

## 2020-06-22 DIAGNOSIS — G4733 Obstructive sleep apnea (adult) (pediatric): Secondary | ICD-10-CM | POA: Diagnosis not present

## 2020-06-23 DIAGNOSIS — M41126 Adolescent idiopathic scoliosis, lumbar region: Secondary | ICD-10-CM | POA: Diagnosis not present

## 2020-06-23 DIAGNOSIS — M9905 Segmental and somatic dysfunction of pelvic region: Secondary | ICD-10-CM | POA: Diagnosis not present

## 2020-06-23 DIAGNOSIS — M9903 Segmental and somatic dysfunction of lumbar region: Secondary | ICD-10-CM | POA: Diagnosis not present

## 2020-06-23 DIAGNOSIS — M62830 Muscle spasm of back: Secondary | ICD-10-CM | POA: Diagnosis not present

## 2020-06-23 DIAGNOSIS — M41124 Adolescent idiopathic scoliosis, thoracic region: Secondary | ICD-10-CM | POA: Diagnosis not present

## 2020-06-23 DIAGNOSIS — M545 Low back pain: Secondary | ICD-10-CM | POA: Diagnosis not present

## 2020-06-23 DIAGNOSIS — M9902 Segmental and somatic dysfunction of thoracic region: Secondary | ICD-10-CM | POA: Diagnosis not present

## 2020-06-23 DIAGNOSIS — M9901 Segmental and somatic dysfunction of cervical region: Secondary | ICD-10-CM | POA: Diagnosis not present

## 2020-06-25 ENCOUNTER — Other Ambulatory Visit: Payer: Self-pay | Admitting: Family Medicine

## 2020-06-25 DIAGNOSIS — M5030 Other cervical disc degeneration, unspecified cervical region: Secondary | ICD-10-CM | POA: Diagnosis not present

## 2020-06-25 DIAGNOSIS — H40009 Preglaucoma, unspecified, unspecified eye: Secondary | ICD-10-CM | POA: Diagnosis not present

## 2020-06-25 DIAGNOSIS — I10 Essential (primary) hypertension: Secondary | ICD-10-CM | POA: Diagnosis not present

## 2020-06-25 DIAGNOSIS — R7301 Impaired fasting glucose: Secondary | ICD-10-CM | POA: Diagnosis not present

## 2020-06-25 DIAGNOSIS — F338 Other recurrent depressive disorders: Secondary | ICD-10-CM | POA: Diagnosis not present

## 2020-06-25 DIAGNOSIS — D563 Thalassemia minor: Secondary | ICD-10-CM | POA: Diagnosis not present

## 2020-06-25 DIAGNOSIS — E782 Mixed hyperlipidemia: Secondary | ICD-10-CM | POA: Diagnosis not present

## 2020-06-25 DIAGNOSIS — Z136 Encounter for screening for cardiovascular disorders: Secondary | ICD-10-CM

## 2020-06-25 DIAGNOSIS — R7309 Other abnormal glucose: Secondary | ICD-10-CM | POA: Diagnosis not present

## 2020-06-25 DIAGNOSIS — Z0000 Encounter for general adult medical examination without abnormal findings: Secondary | ICD-10-CM | POA: Diagnosis not present

## 2020-06-25 DIAGNOSIS — Z87891 Personal history of nicotine dependence: Secondary | ICD-10-CM

## 2020-06-25 DIAGNOSIS — Z23 Encounter for immunization: Secondary | ICD-10-CM | POA: Diagnosis not present

## 2020-06-25 DIAGNOSIS — G4730 Sleep apnea, unspecified: Secondary | ICD-10-CM | POA: Diagnosis not present

## 2020-06-25 DIAGNOSIS — Z1159 Encounter for screening for other viral diseases: Secondary | ICD-10-CM | POA: Diagnosis not present

## 2020-06-25 DIAGNOSIS — G609 Hereditary and idiopathic neuropathy, unspecified: Secondary | ICD-10-CM | POA: Diagnosis not present

## 2020-06-26 DIAGNOSIS — M9902 Segmental and somatic dysfunction of thoracic region: Secondary | ICD-10-CM | POA: Diagnosis not present

## 2020-06-26 DIAGNOSIS — M9903 Segmental and somatic dysfunction of lumbar region: Secondary | ICD-10-CM | POA: Diagnosis not present

## 2020-06-26 DIAGNOSIS — M545 Low back pain: Secondary | ICD-10-CM | POA: Diagnosis not present

## 2020-06-26 DIAGNOSIS — M62830 Muscle spasm of back: Secondary | ICD-10-CM | POA: Diagnosis not present

## 2020-06-26 DIAGNOSIS — M9905 Segmental and somatic dysfunction of pelvic region: Secondary | ICD-10-CM | POA: Diagnosis not present

## 2020-06-26 DIAGNOSIS — M41126 Adolescent idiopathic scoliosis, lumbar region: Secondary | ICD-10-CM | POA: Diagnosis not present

## 2020-06-26 DIAGNOSIS — M9901 Segmental and somatic dysfunction of cervical region: Secondary | ICD-10-CM | POA: Diagnosis not present

## 2020-06-26 DIAGNOSIS — M41124 Adolescent idiopathic scoliosis, thoracic region: Secondary | ICD-10-CM | POA: Diagnosis not present

## 2020-07-01 ENCOUNTER — Ambulatory Visit
Admission: RE | Admit: 2020-07-01 | Discharge: 2020-07-01 | Disposition: A | Discharge status: Home / Self Care | Payer: PPO | Source: Ambulatory Visit | Attending: Family Medicine | Admitting: Family Medicine

## 2020-07-01 DIAGNOSIS — Z87891 Personal history of nicotine dependence: Secondary | ICD-10-CM | POA: Diagnosis not present

## 2020-07-01 DIAGNOSIS — Z136 Encounter for screening for cardiovascular disorders: Secondary | ICD-10-CM

## 2020-07-02 ENCOUNTER — Ambulatory Visit: Payer: PPO

## 2020-07-02 DIAGNOSIS — M41126 Adolescent idiopathic scoliosis, lumbar region: Secondary | ICD-10-CM | POA: Diagnosis not present

## 2020-07-02 DIAGNOSIS — M9901 Segmental and somatic dysfunction of cervical region: Secondary | ICD-10-CM | POA: Diagnosis not present

## 2020-07-02 DIAGNOSIS — M9902 Segmental and somatic dysfunction of thoracic region: Secondary | ICD-10-CM | POA: Diagnosis not present

## 2020-07-02 DIAGNOSIS — M41124 Adolescent idiopathic scoliosis, thoracic region: Secondary | ICD-10-CM | POA: Diagnosis not present

## 2020-07-04 DIAGNOSIS — M9902 Segmental and somatic dysfunction of thoracic region: Secondary | ICD-10-CM | POA: Diagnosis not present

## 2020-07-04 DIAGNOSIS — M9901 Segmental and somatic dysfunction of cervical region: Secondary | ICD-10-CM | POA: Diagnosis not present

## 2020-07-04 DIAGNOSIS — M41126 Adolescent idiopathic scoliosis, lumbar region: Secondary | ICD-10-CM | POA: Diagnosis not present

## 2020-07-04 DIAGNOSIS — M41124 Adolescent idiopathic scoliosis, thoracic region: Secondary | ICD-10-CM | POA: Diagnosis not present

## 2020-07-07 DIAGNOSIS — M41124 Adolescent idiopathic scoliosis, thoracic region: Secondary | ICD-10-CM | POA: Diagnosis not present

## 2020-07-07 DIAGNOSIS — M41126 Adolescent idiopathic scoliosis, lumbar region: Secondary | ICD-10-CM | POA: Diagnosis not present

## 2020-07-07 DIAGNOSIS — M9901 Segmental and somatic dysfunction of cervical region: Secondary | ICD-10-CM | POA: Diagnosis not present

## 2020-07-07 DIAGNOSIS — M9902 Segmental and somatic dysfunction of thoracic region: Secondary | ICD-10-CM | POA: Diagnosis not present

## 2020-07-09 DIAGNOSIS — M9901 Segmental and somatic dysfunction of cervical region: Secondary | ICD-10-CM | POA: Diagnosis not present

## 2020-07-09 DIAGNOSIS — M9902 Segmental and somatic dysfunction of thoracic region: Secondary | ICD-10-CM | POA: Diagnosis not present

## 2020-07-09 DIAGNOSIS — M41124 Adolescent idiopathic scoliosis, thoracic region: Secondary | ICD-10-CM | POA: Diagnosis not present

## 2020-07-09 DIAGNOSIS — M41126 Adolescent idiopathic scoliosis, lumbar region: Secondary | ICD-10-CM | POA: Diagnosis not present

## 2020-07-10 DIAGNOSIS — C61 Malignant neoplasm of prostate: Secondary | ICD-10-CM | POA: Diagnosis not present

## 2020-07-10 DIAGNOSIS — N401 Enlarged prostate with lower urinary tract symptoms: Secondary | ICD-10-CM | POA: Diagnosis not present

## 2020-07-10 DIAGNOSIS — R3911 Hesitancy of micturition: Secondary | ICD-10-CM | POA: Diagnosis not present

## 2020-07-10 DIAGNOSIS — N411 Chronic prostatitis: Secondary | ICD-10-CM | POA: Diagnosis not present

## 2020-07-14 DIAGNOSIS — M41126 Adolescent idiopathic scoliosis, lumbar region: Secondary | ICD-10-CM | POA: Diagnosis not present

## 2020-07-14 DIAGNOSIS — M41124 Adolescent idiopathic scoliosis, thoracic region: Secondary | ICD-10-CM | POA: Diagnosis not present

## 2020-07-14 DIAGNOSIS — M9901 Segmental and somatic dysfunction of cervical region: Secondary | ICD-10-CM | POA: Diagnosis not present

## 2020-07-14 DIAGNOSIS — M9902 Segmental and somatic dysfunction of thoracic region: Secondary | ICD-10-CM | POA: Diagnosis not present

## 2020-07-18 DIAGNOSIS — M9901 Segmental and somatic dysfunction of cervical region: Secondary | ICD-10-CM | POA: Diagnosis not present

## 2020-07-18 DIAGNOSIS — M41126 Adolescent idiopathic scoliosis, lumbar region: Secondary | ICD-10-CM | POA: Diagnosis not present

## 2020-07-18 DIAGNOSIS — M41124 Adolescent idiopathic scoliosis, thoracic region: Secondary | ICD-10-CM | POA: Diagnosis not present

## 2020-07-18 DIAGNOSIS — M9902 Segmental and somatic dysfunction of thoracic region: Secondary | ICD-10-CM | POA: Diagnosis not present

## 2020-07-29 DIAGNOSIS — L820 Inflamed seborrheic keratosis: Secondary | ICD-10-CM | POA: Diagnosis not present

## 2020-07-31 IMAGING — CR DG CHEST 2V
2 series · 2 of 2 positions shown · non-contrast
Comparison: 11/25/2016

CLINICAL DATA: Cough.  Fever.

EXAM:
CHEST - 2 VIEW

[w chest pa]
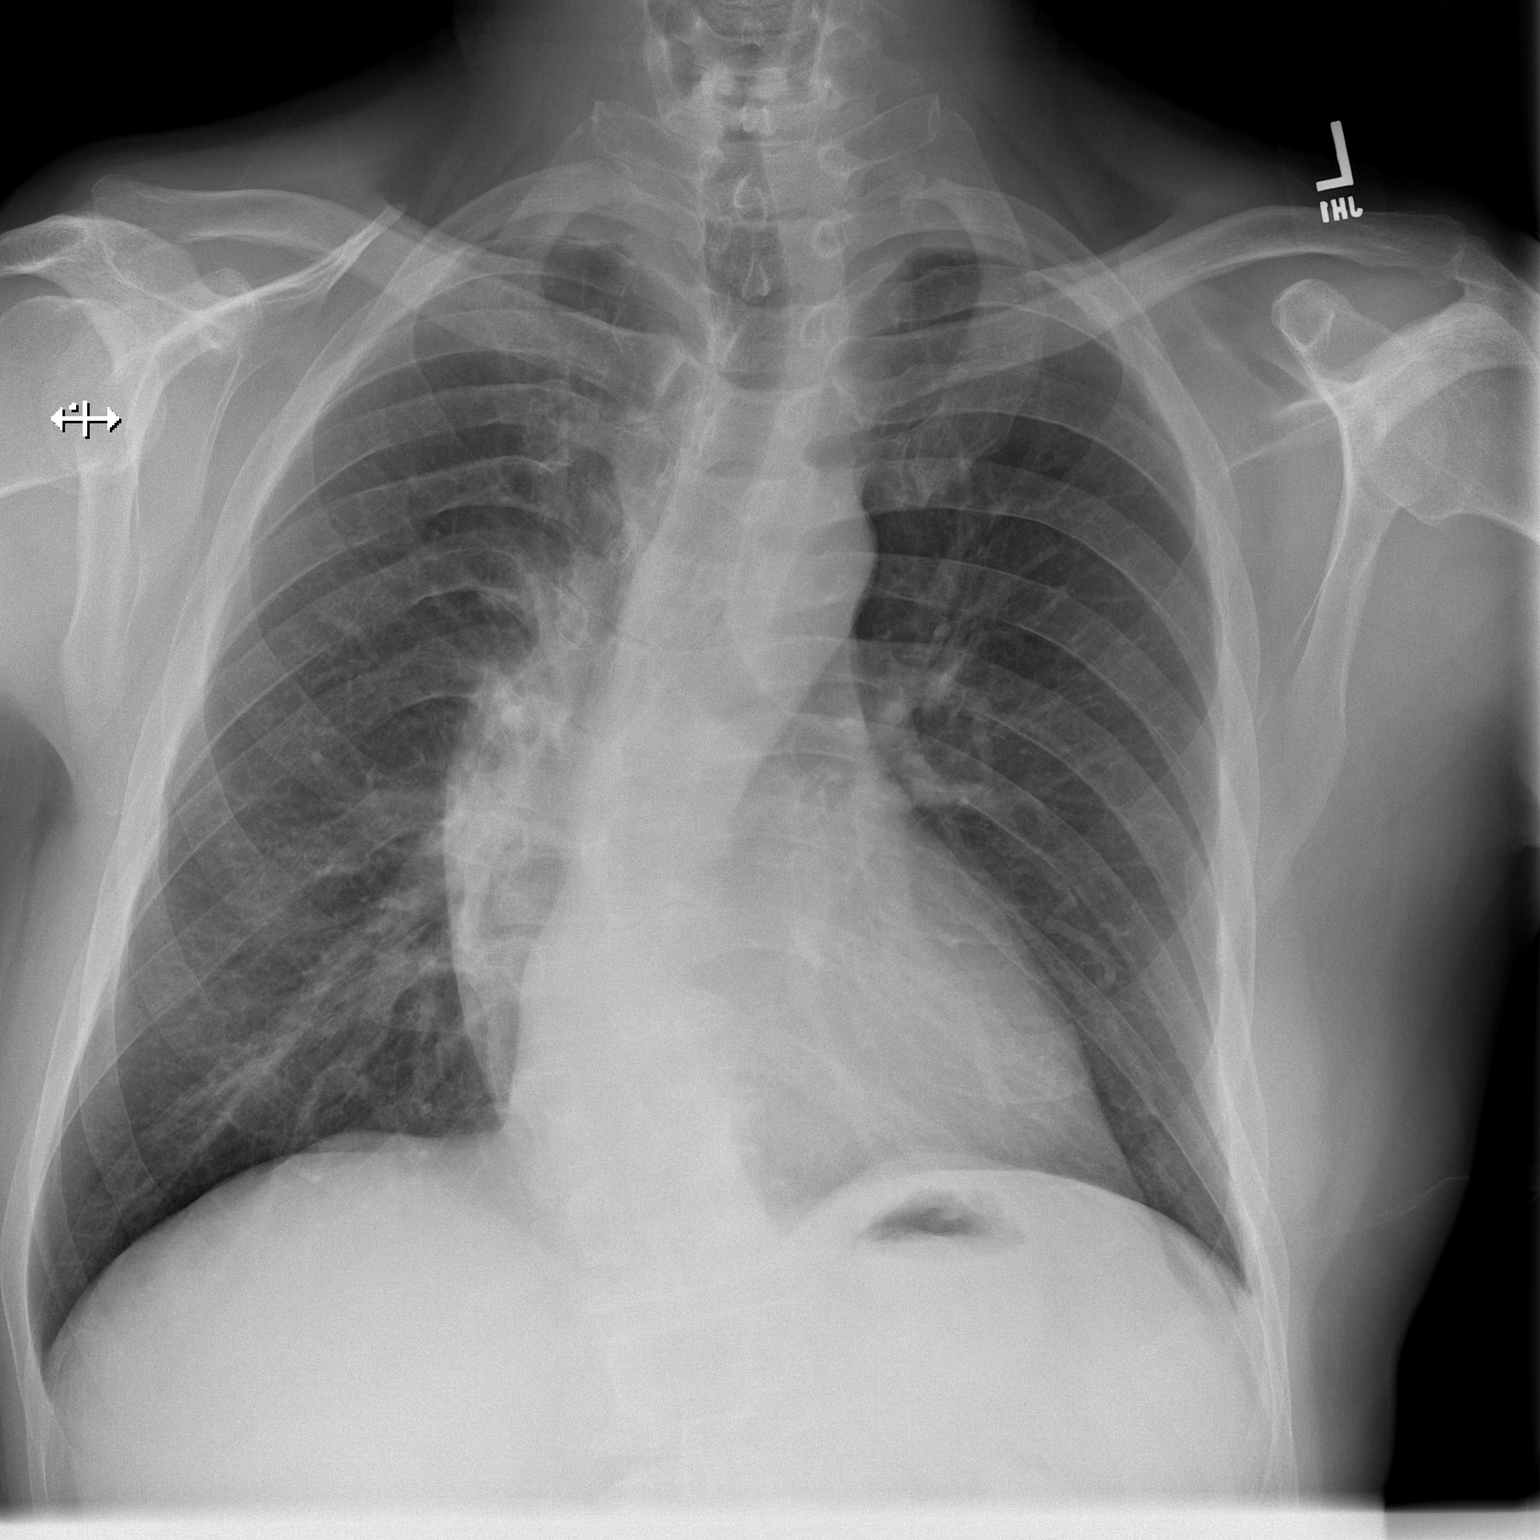

[w chest lat]
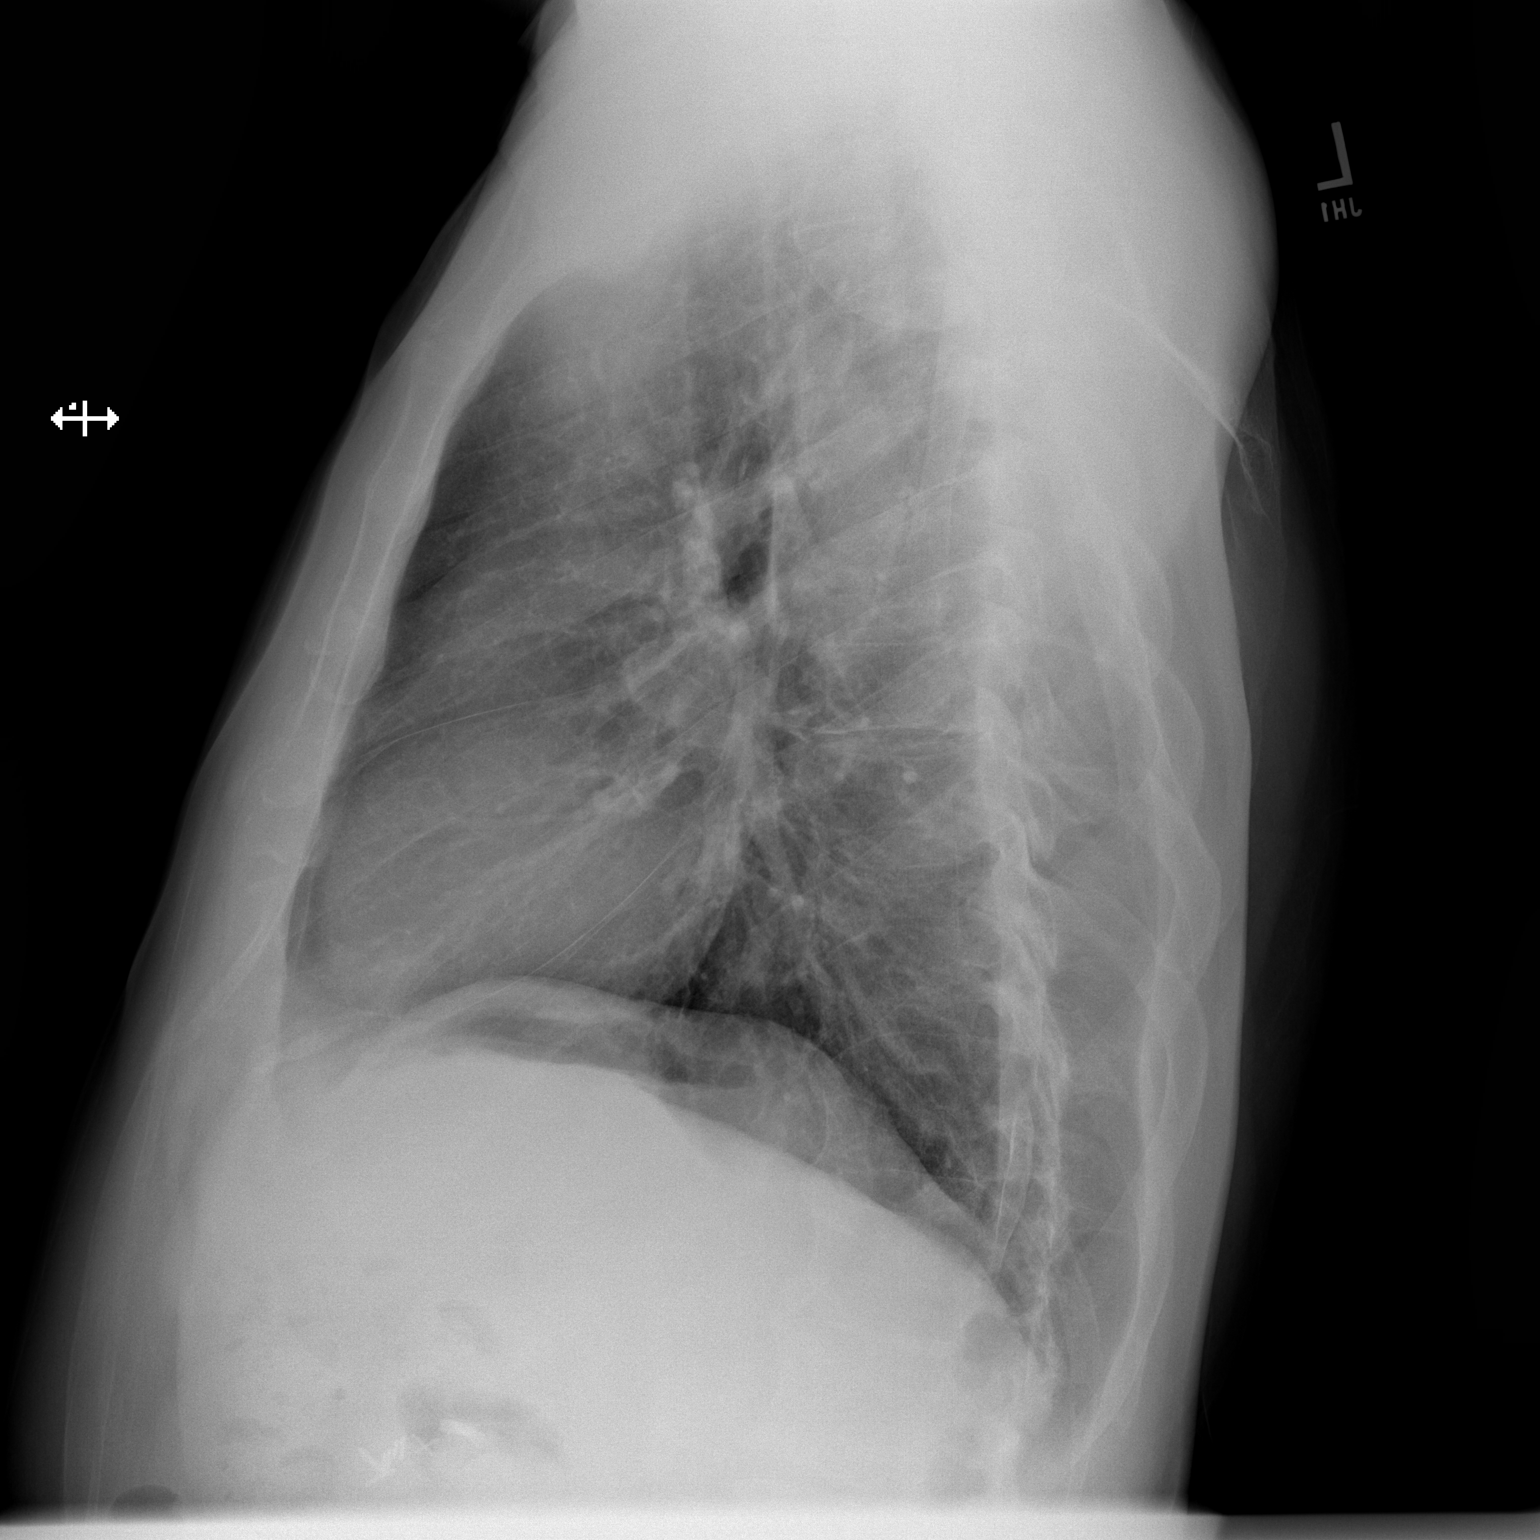

[2 of 2 positions shown; findings below may reference images not displayed]

FINDINGS: The heart size and mediastinal contours are within normal limits.
Both lungs are clear. The visualized skeletal structures are
unremarkable.
IMPRESSION: No active cardiopulmonary disease.

## 2020-09-15 DIAGNOSIS — H6982 Other specified disorders of Eustachian tube, left ear: Secondary | ICD-10-CM | POA: Diagnosis not present

## 2020-09-15 DIAGNOSIS — R42 Dizziness and giddiness: Secondary | ICD-10-CM | POA: Diagnosis not present

## 2020-10-17 ENCOUNTER — Other Ambulatory Visit (HOSPITAL_BASED_OUTPATIENT_CLINIC_OR_DEPARTMENT_OTHER): Payer: Self-pay | Admitting: Family Medicine

## 2020-10-17 ENCOUNTER — Other Ambulatory Visit: Payer: Self-pay

## 2020-10-17 ENCOUNTER — Ambulatory Visit (HOSPITAL_BASED_OUTPATIENT_CLINIC_OR_DEPARTMENT_OTHER)
Admission: RE | Admit: 2020-10-17 | Discharge: 2020-10-17 | Disposition: A | Discharge status: Home / Self Care | Payer: PPO | Source: Ambulatory Visit | Attending: Family Medicine | Admitting: Family Medicine

## 2020-10-17 DIAGNOSIS — G4730 Sleep apnea, unspecified: Secondary | ICD-10-CM | POA: Diagnosis not present

## 2020-10-17 DIAGNOSIS — R0981 Nasal congestion: Secondary | ICD-10-CM | POA: Diagnosis not present

## 2020-10-17 DIAGNOSIS — E782 Mixed hyperlipidemia: Secondary | ICD-10-CM | POA: Diagnosis not present

## 2020-10-17 DIAGNOSIS — Z8546 Personal history of malignant neoplasm of prostate: Secondary | ICD-10-CM | POA: Diagnosis not present

## 2020-10-17 DIAGNOSIS — R7303 Prediabetes: Secondary | ICD-10-CM | POA: Diagnosis not present

## 2020-10-17 DIAGNOSIS — R519 Headache, unspecified: Secondary | ICD-10-CM

## 2020-10-17 DIAGNOSIS — R42 Dizziness and giddiness: Secondary | ICD-10-CM | POA: Diagnosis not present

## 2020-11-12 IMAGING — US US ABDOMINAL AORTA SCREENING AAA
1 series · 14 of 19 positions shown · non-contrast
Comparison: None.

CLINICAL DATA: Patient between 65-75 years of age with family
history of AAA.

EXAM:
US ABDOMINAL AORTA MEDICARE SCREENING
TECHNIQUE: Ultrasound examination of the abdominal aorta was performed as a
screening evaluation for abdominal aortic aneurysm.

[Series 1: us abdominal aorta screening aaa · 0.26mm/px · 14 of 19 slices shown]
[im 1/19]
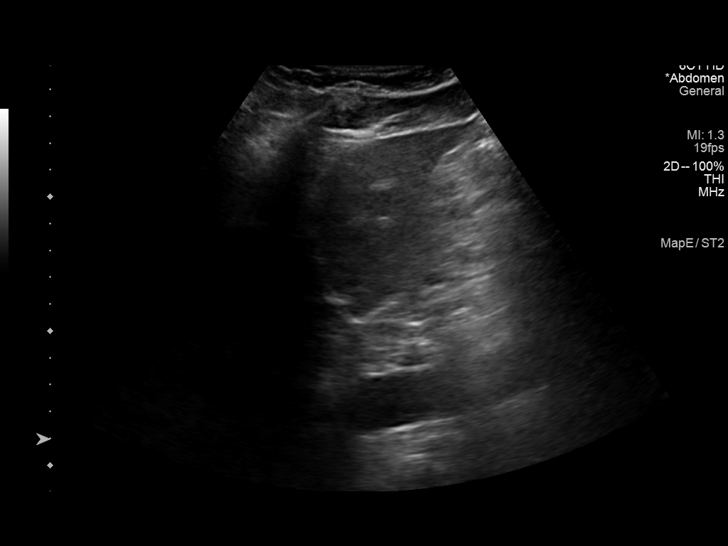
[im 3/19]
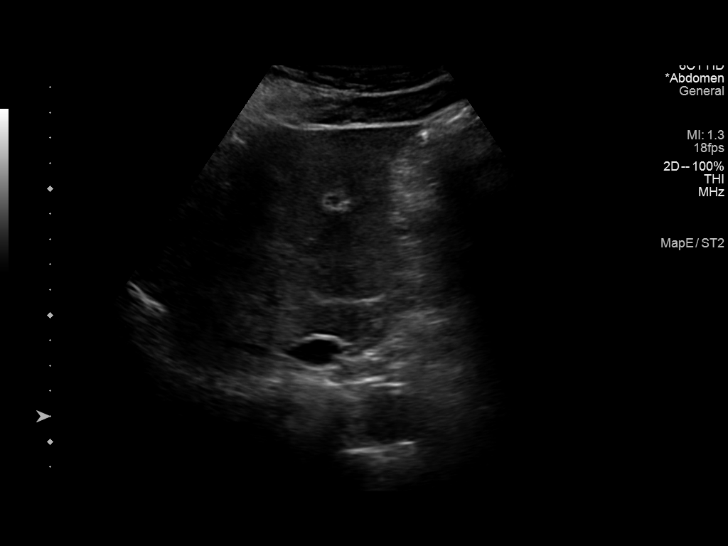
[im 4/19]
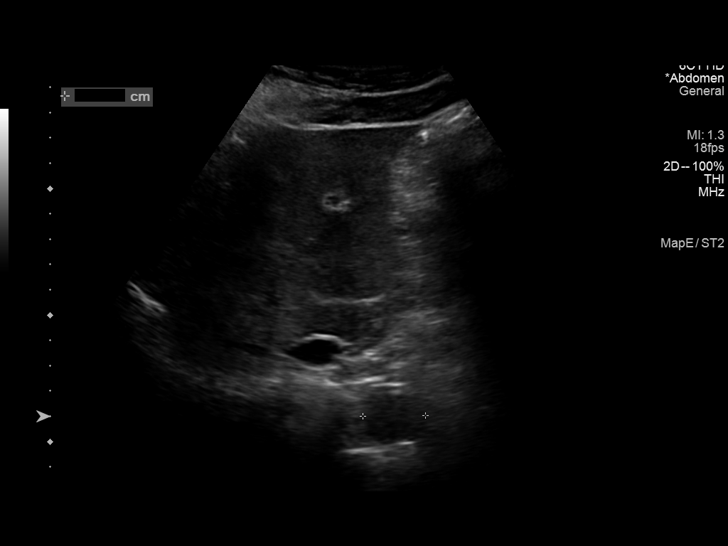
[im 5/19]
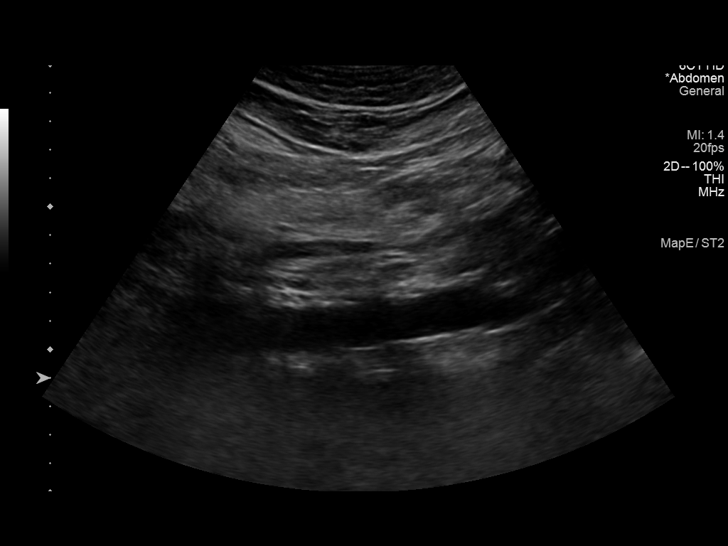
[im 7/19]
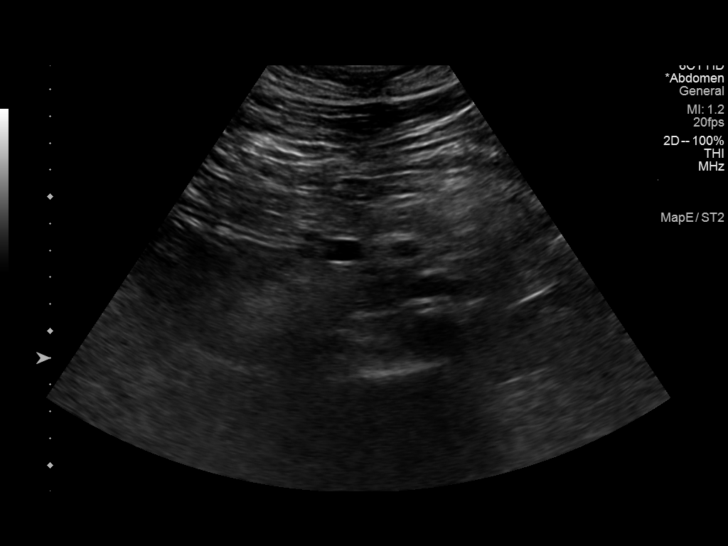
[im 8/19]
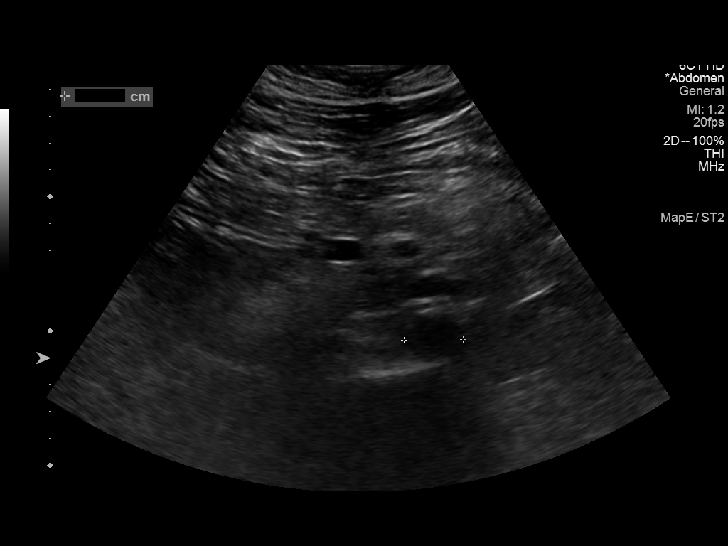
[im 9/19]
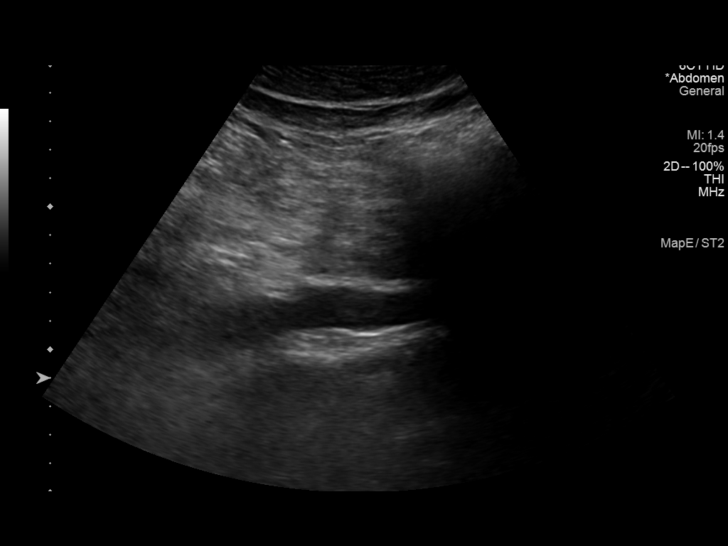
[im 11/19]
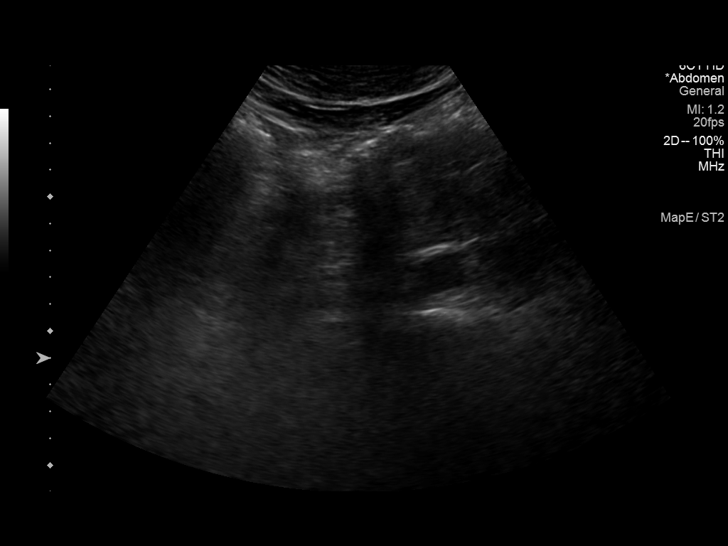
[im 12/19]
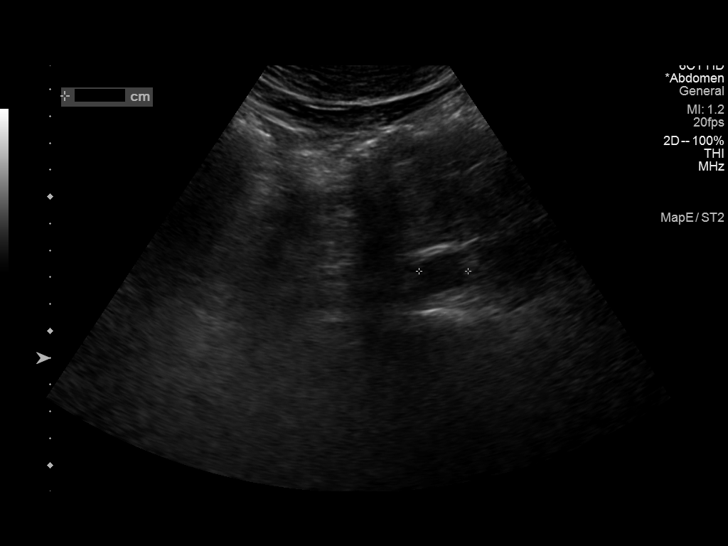
[im 13/19]
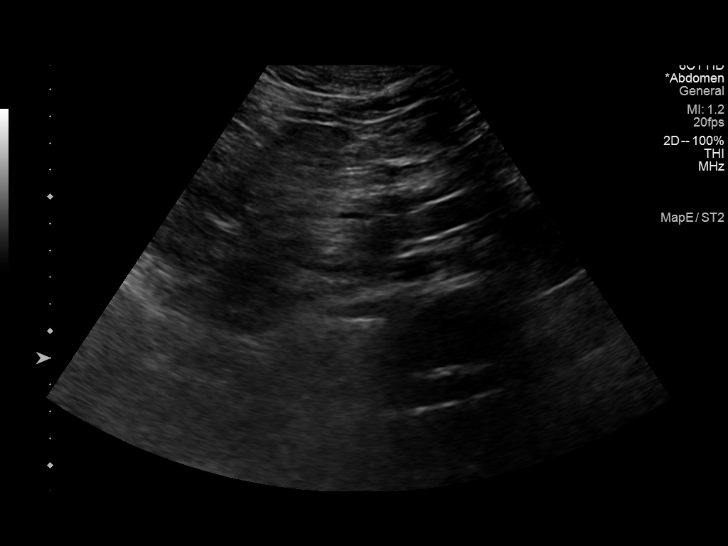
[im 15/19]
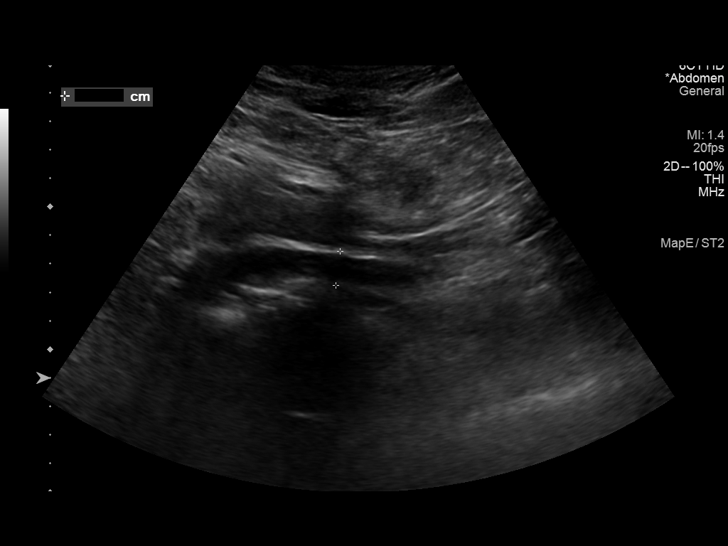
[im 16/19]
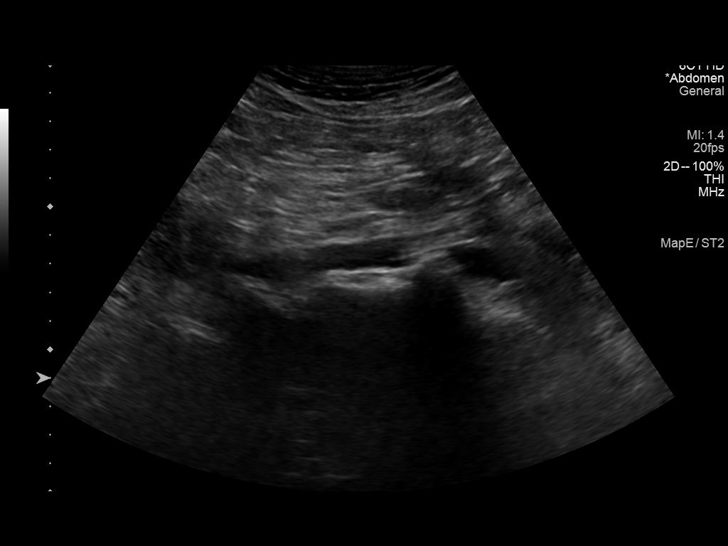
[im 17/19]
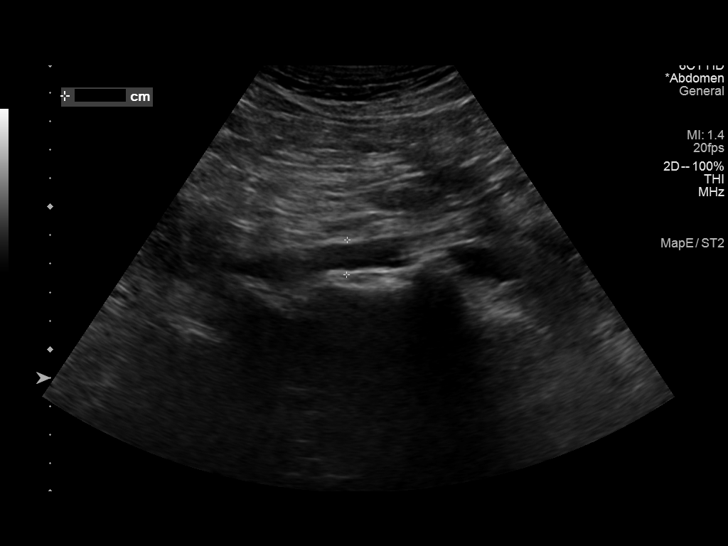
[im 19/19]
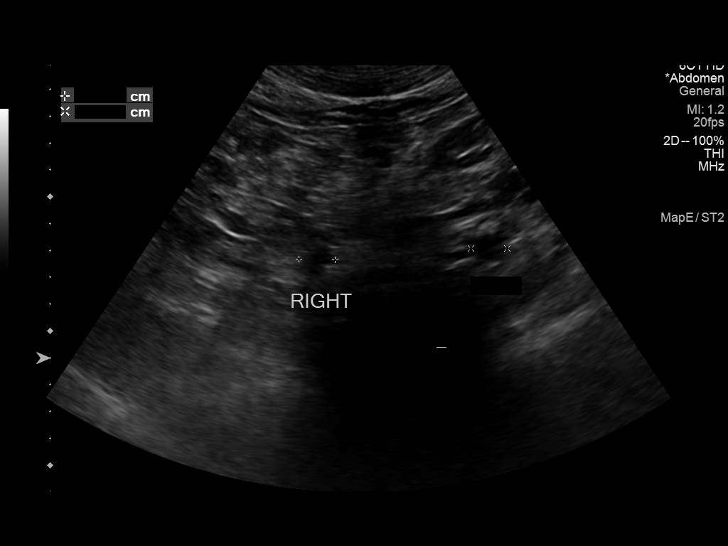

[14 of 19 positions shown; findings below may reference images not displayed]

FINDINGS: Abdominal aortic measurements as follows:

Proximal:  2.5 x 2.3 cm

Mid:  2.2 x 1.8 cm

Distal:  1.8 x 1.8 cm

Right common iliac artery: 1.4 x 1.2 cm

Left Common iliac artery: 1.4 x 1.2 cm
IMPRESSION: No evidence of abdominal aortic aneurysm.

## 2020-11-25 DIAGNOSIS — M4726 Other spondylosis with radiculopathy, lumbar region: Secondary | ICD-10-CM | POA: Diagnosis not present

## 2020-11-25 DIAGNOSIS — M419 Scoliosis, unspecified: Secondary | ICD-10-CM | POA: Diagnosis not present

## 2020-11-25 DIAGNOSIS — M4156 Other secondary scoliosis, lumbar region: Secondary | ICD-10-CM | POA: Diagnosis not present

## 2020-12-03 DIAGNOSIS — M545 Low back pain, unspecified: Secondary | ICD-10-CM | POA: Diagnosis not present

## 2020-12-08 DIAGNOSIS — M25775 Osteophyte, left foot: Secondary | ICD-10-CM | POA: Diagnosis not present

## 2020-12-08 DIAGNOSIS — L600 Ingrowing nail: Secondary | ICD-10-CM | POA: Diagnosis not present

## 2020-12-08 DIAGNOSIS — G4733 Obstructive sleep apnea (adult) (pediatric): Secondary | ICD-10-CM | POA: Diagnosis not present

## 2020-12-10 DIAGNOSIS — M545 Low back pain, unspecified: Secondary | ICD-10-CM | POA: Diagnosis not present

## 2020-12-17 DIAGNOSIS — M545 Low back pain, unspecified: Secondary | ICD-10-CM | POA: Diagnosis not present

## 2020-12-25 DIAGNOSIS — M545 Low back pain, unspecified: Secondary | ICD-10-CM | POA: Diagnosis not present

## 2020-12-31 DIAGNOSIS — M545 Low back pain, unspecified: Secondary | ICD-10-CM | POA: Diagnosis not present

## 2021-01-01 DIAGNOSIS — S50812A Abrasion of left forearm, initial encounter: Secondary | ICD-10-CM | POA: Diagnosis not present

## 2021-01-01 DIAGNOSIS — S50811A Abrasion of right forearm, initial encounter: Secondary | ICD-10-CM | POA: Diagnosis not present

## 2021-01-07 DIAGNOSIS — H26493 Other secondary cataract, bilateral: Secondary | ICD-10-CM | POA: Diagnosis not present

## 2021-01-07 DIAGNOSIS — H524 Presbyopia: Secondary | ICD-10-CM | POA: Diagnosis not present

## 2021-01-07 DIAGNOSIS — H40023 Open angle with borderline findings, high risk, bilateral: Secondary | ICD-10-CM | POA: Diagnosis not present

## 2021-01-07 DIAGNOSIS — M545 Low back pain, unspecified: Secondary | ICD-10-CM | POA: Diagnosis not present

## 2021-01-07 DIAGNOSIS — G43B0 Ophthalmoplegic migraine, not intractable: Secondary | ICD-10-CM | POA: Diagnosis not present

## 2021-01-07 DIAGNOSIS — H353112 Nonexudative age-related macular degeneration, right eye, intermediate dry stage: Secondary | ICD-10-CM | POA: Diagnosis not present

## 2021-03-02 DIAGNOSIS — M419 Scoliosis, unspecified: Secondary | ICD-10-CM | POA: Diagnosis not present

## 2021-03-02 DIAGNOSIS — M545 Low back pain, unspecified: Secondary | ICD-10-CM | POA: Diagnosis not present

## 2021-06-01 DIAGNOSIS — H40023 Open angle with borderline findings, high risk, bilateral: Secondary | ICD-10-CM | POA: Diagnosis not present

## 2021-06-04 DIAGNOSIS — G4752 REM sleep behavior disorder: Secondary | ICD-10-CM | POA: Diagnosis not present

## 2021-06-04 DIAGNOSIS — G4733 Obstructive sleep apnea (adult) (pediatric): Secondary | ICD-10-CM | POA: Diagnosis not present

## 2021-06-27 DIAGNOSIS — M545 Low back pain, unspecified: Secondary | ICD-10-CM | POA: Diagnosis not present

## 2021-06-30 ENCOUNTER — Telehealth: Payer: Self-pay

## 2021-06-30 ENCOUNTER — Other Ambulatory Visit: Payer: Self-pay | Admitting: Orthopedic Surgery

## 2021-06-30 DIAGNOSIS — G8929 Other chronic pain: Secondary | ICD-10-CM

## 2021-06-30 NOTE — Progress Notes (Addendum)
Phone call to patient to discuss order for intradiscal injection. Reviewed pts allergies and most recent weight. Pt was advised not to take oral antibiotics for this procedure that we would give him IV antibiotics prior. Discharge instructions also reviewed with patient and instructed patient to have a driver the day of the procedure. Pt verbalized understanding.  

## 2021-07-03 DIAGNOSIS — M419 Scoliosis, unspecified: Secondary | ICD-10-CM | POA: Diagnosis not present

## 2021-07-09 DIAGNOSIS — Z23 Encounter for immunization: Secondary | ICD-10-CM | POA: Diagnosis not present

## 2021-07-09 DIAGNOSIS — Z0000 Encounter for general adult medical examination without abnormal findings: Secondary | ICD-10-CM | POA: Diagnosis not present

## 2021-07-14 ENCOUNTER — Other Ambulatory Visit: Payer: PPO

## 2021-07-14 DIAGNOSIS — M549 Dorsalgia, unspecified: Secondary | ICD-10-CM | POA: Diagnosis not present

## 2021-07-14 DIAGNOSIS — G4733 Obstructive sleep apnea (adult) (pediatric): Secondary | ICD-10-CM | POA: Diagnosis not present

## 2021-07-14 DIAGNOSIS — F3341 Major depressive disorder, recurrent, in partial remission: Secondary | ICD-10-CM | POA: Diagnosis not present

## 2021-07-14 DIAGNOSIS — R7303 Prediabetes: Secondary | ICD-10-CM | POA: Diagnosis not present

## 2021-07-14 DIAGNOSIS — I10 Essential (primary) hypertension: Secondary | ICD-10-CM | POA: Diagnosis not present

## 2021-07-14 DIAGNOSIS — C61 Malignant neoplasm of prostate: Secondary | ICD-10-CM | POA: Diagnosis not present

## 2021-07-14 DIAGNOSIS — Z0000 Encounter for general adult medical examination without abnormal findings: Secondary | ICD-10-CM | POA: Diagnosis not present

## 2021-07-16 DIAGNOSIS — G4752 REM sleep behavior disorder: Secondary | ICD-10-CM | POA: Diagnosis not present

## 2021-08-17 DIAGNOSIS — N529 Male erectile dysfunction, unspecified: Secondary | ICD-10-CM | POA: Diagnosis not present

## 2021-08-17 DIAGNOSIS — N401 Enlarged prostate with lower urinary tract symptoms: Secondary | ICD-10-CM | POA: Diagnosis not present

## 2021-08-17 DIAGNOSIS — N411 Chronic prostatitis: Secondary | ICD-10-CM | POA: Diagnosis not present

## 2021-08-17 DIAGNOSIS — R3911 Hesitancy of micturition: Secondary | ICD-10-CM | POA: Diagnosis not present

## 2021-08-17 DIAGNOSIS — C61 Malignant neoplasm of prostate: Secondary | ICD-10-CM | POA: Diagnosis not present

## 2021-09-16 DIAGNOSIS — G4752 REM sleep behavior disorder: Secondary | ICD-10-CM | POA: Diagnosis not present

## 2022-02-12 ENCOUNTER — Other Ambulatory Visit: Payer: Self-pay | Admitting: Student

## 2022-02-12 DIAGNOSIS — M5416 Radiculopathy, lumbar region: Secondary | ICD-10-CM

## 2022-02-21 ENCOUNTER — Ambulatory Visit
Admission: RE | Admit: 2022-02-21 | Discharge: 2022-02-21 | Disposition: A | Discharge status: Home / Self Care | Payer: PPO | Source: Ambulatory Visit | Attending: Student | Admitting: Student

## 2022-02-21 DIAGNOSIS — M5416 Radiculopathy, lumbar region: Secondary | ICD-10-CM

## 2022-06-07 ENCOUNTER — Telehealth: Payer: Self-pay

## 2022-06-07 NOTE — Telephone Encounter (Signed)
NOTES SCANNED TO REFERRAL 

## 2022-06-09 ENCOUNTER — Ambulatory Visit: Payer: PPO | Attending: Internal Medicine | Admitting: Internal Medicine

## 2022-06-09 ENCOUNTER — Encounter: Payer: Self-pay | Admitting: Internal Medicine

## 2022-06-09 VITALS — BP 124/68 | HR 88 | Ht 72.0 in | Wt 212.0 lb

## 2022-06-09 DIAGNOSIS — R0789 Other chest pain: Secondary | ICD-10-CM | POA: Diagnosis not present

## 2022-06-09 DIAGNOSIS — R0602 Shortness of breath: Secondary | ICD-10-CM

## 2022-06-09 DIAGNOSIS — R42 Dizziness and giddiness: Secondary | ICD-10-CM

## 2022-06-09 DIAGNOSIS — Z8639 Personal history of other endocrine, nutritional and metabolic disease: Secondary | ICD-10-CM

## 2022-06-09 DIAGNOSIS — Z8249 Family history of ischemic heart disease and other diseases of the circulatory system: Secondary | ICD-10-CM | POA: Diagnosis not present

## 2022-06-09 MED ORDER — METOPROLOL TARTRATE 50 MG PO TABS: 50.0000 mg | ORAL_TABLET | ORAL | 0 refills | Status: DC

## 2022-06-09 NOTE — Progress Notes (Signed)
OFFICE CONSULT NOTE  Chief Complaint:  Shortness of breath, chest tightness  Primary Care Physician: Hulan Fess, MD  HPI:  Patrick Robar. is a 71 y.o. male who is being seen today for the evaluation of shortness of breath and chest tightness at the request of Little, Lennette Bihari, MD. this is a pleasant 71 year old male whose wife is a patient of mine.  He has recently had worsening shortness of breath and chest tightness.  He reports longstanding history of significant scoliosis, diagnosed at a young age.  He also has a history of hypertension, kidney stones and chronic pain.  He is concerned about his tightness and shortness of breath as it may represent heart disease.  He reports his father had an MI at age 39.  He also was noted to have bilateral carotid artery disease but was a smoker.  [name] has a remote history of smoking but quit more than 30 years ago.  He previously was on lisinopril for hypertension but says he stopped it and his blood pressures have been normal.  He also used to take Lovaza but is no longer on that.  His most recent recent lipid profile was in October 2022 showing total cholesterol 178, HDL 32, triglycerides 172 and LDL 115.  Renal function was normal.  A1c 5.8%.  EKG today shows normal sinus rhythm at 86 and possible left atrial enlargement with nonspecific ST changes.  PMHx:  Past Medical History:  Diagnosis Date   Degenerative arthritis of lumbar spine    Hypertension    Kidney stone    Prostate pain    Scoliosis     Past Surgical History:  Procedure Laterality Date   CHOLECYSTECTOMY     injections in the back     KNEE ARTHROSCOPY      FAMHx:  Family History  Problem Relation Age of Onset   Neuropathy Father     SOCHx:   reports that he has quit smoking. He has never used smokeless tobacco. He reports current alcohol use. He reports that he does not use drugs.  ALLERGIES:  Allergies  Allergen Reactions   Fluoxetine Hcl     ROS: Pertinent  items noted in HPI and remainder of comprehensive ROS otherwise negative.  HOME MEDS: Current Outpatient Medications on File Prior to Visit  Medication Sig Dispense Refill   acetaminophen (TYLENOL) 325 MG tablet Take by mouth.     clonazePAM (KLONOPIN) 0.5 MG tablet clonazepam 0.5 mg tablet     HYDROcodone-acetaminophen (NORCO/VICODIN) 5-325 MG tablet Take 0.5-1 tablets by mouth every 6 (six) hours as needed for moderate pain.      hydrocortisone (ANUSOL-HC) 2.5 % rectal cream Apply topically 2 (two) times daily.     loratadine (CLARITIN) 10 MG tablet Take 10 mg by mouth daily as needed for allergies.      Multiple Vitamins-Minerals (PRESERVISION AREDS 2 PO) Take 1 tablet by mouth 2 (two) times daily.     PARoxetine (PAXIL) 40 MG tablet Take 40 mg by mouth at bedtime.      No current facility-administered medications on file prior to visit.    LABS/IMAGING: No results found for this or any previous visit (from the past 48 hour(s)). No results found.  LIPID PANEL: No results found for: "CHOL", "TRIG", "HDL", "CHOLHDL", "VLDL", "LDLCALC", "LDLDIRECT"  WEIGHTS: Wt Readings from Last 3 Encounters:  06/09/22 212 lb (96.2 kg)  06/30/21 201 lb (91.2 kg)  05/29/19 208 lb 12.8 oz (94.7 kg)  VITALS: BP 124/68   Pulse 88   Ht 6' (1.829 m)   Wt 212 lb (96.2 kg)   SpO2 98%   BMI 28.75 kg/m   EXAM: General appearance: alert and no distress Neck: no carotid bruit, no JVD, thyroid not enlarged, symmetric, no tenderness/mass/nodules, and right earlobe crease Lungs: clear to auscultation bilaterally Heart: regular rate and rhythm, S1, S2 normal, no murmur, click, rub or gallop Abdomen: soft, non-tender; bowel sounds normal; no masses,  no organomegaly Extremities: extremities normal, atraumatic, no cyanosis or edema and marked thoracic scoliosis Pulses: 2+ and symmetric Skin: Skin color, texture, turgor normal. No rashes or lesions Neurologic: Grossly normal Psych:  Pleasant  EKG: Normal sinus rhythm at 86, possible left atrial argument with nonspecific ST changes- personally reviewed  ASSESSMENT: Progressive dyspnea on exertion and chest tightness History of premature coronary artery disease in his father History of hypertension-resolved Remote smoking history Vitamin D deficiency  PLAN: 1.   [name] has had progressive dyspnea on exertion and chest tightness.  He does have significant thoracic scoliosis but his symptoms have worsened recently.  He does have a family history of early onset heart disease in his father and was a former smoker.  He was previously on medication for blood pressure but he says its been more normal recently.  I would like to do a coronary evaluation.  I recommend a coronary CT angiogram.  He will likely need 50 mg metoprolol prior to the procedure to help with the heart rate.  I will contact him with those results and will make medication recommendations accordingly.  He says he has follow-up with his PCP for an annual visit in a few months.  He will likely get labs at that time.  He noted he had a history of vitamin D deficiency.  We will go ahead and check that now as well per his request.  He previously was on high-dose vitamin D but did not remain on supplementation.  Thanks again for the kind referral.  Follow-up with me afterwards.  Pixie Casino, MD, [facility], [place] Director of the Advanced Lipid Disorders &  Cardiovascular Risk Reduction Clinic Diplomate of the American Board of Clinical Lipidology Attending Cardiologist  Direct Dial: [phone]  Fax: [phone]  Website:  [email or web address] 06/09/2022, 12:51 PM

## 2022-06-09 NOTE — Patient Instructions (Signed)
Medication Instructions:  NO CHANGES  You will need to take a one-time dose of metoprolol tartrate two hours before your CT test  *If you need a refill on your cardiac medications before your next appointment, please call your pharmacy*   Lab Work: BMET, Vit D today   If you have labs (blood work) drawn today and your tests are completely normal, you will receive your results only by: [address] (if you have MyChart) OR A paper copy in the mail If you have any lab test that is abnormal or we need to change your treatment, we will call you to review the results.   Testing/Procedures: Coronary CT at [facility]  Carotid Doppler at Dr. Lysbeth Penner office   Follow-Up: At [facility], you and your health needs are our priority.  As part of our continuing mission to provide you with exceptional heart care, we have created designated Provider Care Teams.  These Care Teams include your primary Cardiologist (physician) and Advanced Practice Providers (APPs -  Physician Assistants and Nurse Practitioners) who all work together to provide you with the care you need, when you need it.  We recommend signing up for the patient portal called "MyChart".  Sign up information is provided on this After Visit Summary.  MyChart is used to connect with patients for Virtual Visits (Telemedicine).  Patients are able to view lab/test results, encounter notes, upcoming appointments, etc.  Non-urgent messages can be sent to your provider as well.   To learn more about what you can do with MyChart, go to [email or web address].    Your next appointment:   1-2 months with Dr. Debara Pickett   Other Instructions    Your cardiac CT will be scheduled at one of the below locations:   [facility] [address] [place], [place] [address] [phone]  [place] [address] [place], [place] [address] [phone]  If scheduled at  [facility], please arrive at the [facility] and Children's Entrance (Entrance C2) of [facility] 30 minutes prior to test start time. You can use the FREE valet parking offered at entrance C (encouraged to control the heart rate for the test)  Proceed to the [facility] Radiology Department (first floor) to check-in and test prep.  All radiology patients and guests should use entrance C2 at [facility], accessed from [facility], even though the hospital's physical address listed is [address]    If scheduled at [facility], please arrive 15 mins early for check-in and test prep.  Please follow these instructions carefully (unless otherwise directed):  Hold all erectile dysfunction medications at least 3 days (72 hrs) prior to test.  On the Night Before the Test: Be sure to Drink plenty of water. Do not consume any caffeinated/decaffeinated beverages or chocolate 12 hours prior to your test. Do not take any antihistamines 12 hours prior to your test.  On the Day of the Test: Drink plenty of water until 1 hour prior to the test. Do not eat any food 4 hours prior to the test. You may take your regular medications prior to the test.  Take metoprolol (Lopressor) two hours prior to test.      After the Test: Drink plenty of water. After receiving IV contrast, you may experience a mild flushed feeling. This is normal. On occasion, you may experience a mild rash up to 24 hours after the  test. This is not dangerous. If this occurs, you can take Benadryl 25 mg and increase your fluid intake. If you experience trouble breathing, this can be serious. If it is severe call 911 IMMEDIATELY. If it is mild, please call our office. If you take any of these medications: Glipizide/Metformin, Avandament, Glucavance, please do not take 48 hours after completing test unless otherwise instructed.  We will call to schedule your test 2-4  weeks out understanding that some insurance companies will need an authorization prior to the service being performed.   For non-scheduling related questions, please contact the cardiac imaging nurse navigator should you have any questions/concerns: Marchia Bond, Cardiac Imaging Nurse Navigator Gordy Clement, Cardiac Imaging Nurse Navigator [facility] Direct Office Dial: [phone]   For scheduling needs, including cancellations and rescheduling, please call [place], [phone].

## 2022-06-10 ENCOUNTER — Other Ambulatory Visit: Payer: Self-pay | Admitting: Internal Medicine

## 2022-06-10 ENCOUNTER — Ambulatory Visit (HOSPITAL_COMMUNITY)
Admission: RE | Admit: 2022-06-10 | Discharge: 2022-06-10 | Disposition: A | Discharge status: Home / Self Care | Payer: PPO | Source: Ambulatory Visit | Attending: Internal Medicine | Admitting: Internal Medicine

## 2022-06-10 DIAGNOSIS — Z8249 Family history of ischemic heart disease and other diseases of the circulatory system: Secondary | ICD-10-CM | POA: Diagnosis not present

## 2022-06-10 DIAGNOSIS — R42 Dizziness and giddiness: Secondary | ICD-10-CM | POA: Insufficient documentation

## 2022-06-10 LAB — BASIC METABOLIC PANEL
BUN/Creatinine Ratio: 17 (ref 10–24)
BUN: 19 mg/dL (ref 8–27)
CO2: 25 mmol/L (ref 20–29)
Calcium: 9.6 mg/dL (ref 8.6–10.2)
Chloride: 102 mmol/L (ref 96–106)
Creatinine, Ser: 1.14 mg/dL (ref 0.76–1.27)
Glucose: 233 mg/dL — ABNORMAL HIGH (ref 70–99)
Potassium: 4.6 mmol/L (ref 3.5–5.2)
Sodium: 140 mmol/L (ref 134–144)
eGFR: 70 mL/min/{1.73_m2} (ref >59)

## 2022-06-10 LAB — VITAMIN D 25 HYDROXY (VIT D DEFICIENCY, FRACTURES): Vit D, 25-Hydroxy: 24.4 ng/mL — ABNORMAL LOW (ref 30.0–100.0)

## 2022-06-11 ENCOUNTER — Other Ambulatory Visit: Payer: Self-pay | Admitting: *Deleted

## 2022-06-11 DIAGNOSIS — E559 Vitamin D deficiency, unspecified: Secondary | ICD-10-CM

## 2022-06-11 MED ORDER — VITAMIN D (ERGOCALCIFEROL) 1.25 MG (50000 UNIT) PO CAPS: 50000.0000 [IU] | ORAL_CAPSULE | ORAL | 0 refills | Status: AC

## 2022-06-15 ENCOUNTER — Other Ambulatory Visit: Payer: Self-pay | Admitting: *Deleted

## 2022-06-15 DIAGNOSIS — I6521 Occlusion and stenosis of right carotid artery: Secondary | ICD-10-CM

## 2022-06-25 ENCOUNTER — Telehealth (HOSPITAL_COMMUNITY): Payer: Self-pay | Admitting: *Deleted

## 2022-06-25 NOTE — Telephone Encounter (Signed)
Reaching out to patient to offer assistance regarding upcoming cardiac imaging study; pt verbalizes understanding of appt date/time, parking situation and where to check in, pre-test NPO status and medications ordered, and verified current allergies; name and call back number provided for further questions should they arise  Gordy Clement RN [facility] [phone] office [phone] cell  Patient confirmed HR was 71bpm over the phone. He is to take '50mg'$  metoprolol tartrate two hours prior to his cardiac CT scan. He is aware to arrive at 12pm.

## 2022-06-28 ENCOUNTER — Ambulatory Visit (HOSPITAL_COMMUNITY)
Admission: RE | Admit: 2022-06-28 | Discharge: 2022-06-28 | Disposition: A | Discharge status: Home / Self Care | Payer: PPO | Source: Ambulatory Visit | Attending: Internal Medicine | Admitting: Internal Medicine

## 2022-06-28 ENCOUNTER — Other Ambulatory Visit: Payer: Self-pay | Admitting: Cardiology

## 2022-06-28 DIAGNOSIS — I2510 Atherosclerotic heart disease of native coronary artery without angina pectoris: Secondary | ICD-10-CM | POA: Insufficient documentation

## 2022-06-28 DIAGNOSIS — R0602 Shortness of breath: Secondary | ICD-10-CM | POA: Diagnosis present

## 2022-06-28 DIAGNOSIS — R0789 Other chest pain: Secondary | ICD-10-CM | POA: Diagnosis present

## 2022-06-28 DIAGNOSIS — R931 Abnormal findings on diagnostic imaging of heart and coronary circulation: Secondary | ICD-10-CM

## 2022-06-28 DIAGNOSIS — Z8249 Family history of ischemic heart disease and other diseases of the circulatory system: Secondary | ICD-10-CM | POA: Insufficient documentation

## 2022-06-28 MED ORDER — NITROGLYCERIN 0.4 MG SL SUBL: 0.8000 mg | SUBLINGUAL_TABLET | Freq: Once | SUBLINGUAL | Status: AC

## 2022-06-28 MED ORDER — NITROGLYCERIN 0.4 MG SL SUBL
SUBLINGUAL_TABLET | SUBLINGUAL | Status: AC
  Administered 2022-06-28: 0.8 mg via SUBLINGUAL
  Filled 2022-06-28: qty 2

## 2022-06-28 MED ORDER — NITROGLYCERIN 0.4 MG SL SUBL: 0.8000 mg | SUBLINGUAL_TABLET | Freq: Once | SUBLINGUAL | Status: DC

## 2022-06-28 MED ORDER — IOHEXOL 350 MG/ML SOLN
100.0000 mL | Freq: Once | INTRAVENOUS | Status: AC | PRN
  Administered 2022-06-28: 100 mL via INTRAVENOUS

## 2022-06-29 ENCOUNTER — Ambulatory Visit (HOSPITAL_COMMUNITY)
Admission: RE | Admit: 2022-06-29 | Discharge: 2022-06-29 | Disposition: A | Discharge status: Home / Self Care | Payer: PPO | Source: Ambulatory Visit | Attending: Cardiology | Admitting: Cardiology

## 2022-06-29 ENCOUNTER — Telehealth: Payer: Self-pay | Admitting: Internal Medicine

## 2022-06-29 DIAGNOSIS — R931 Abnormal findings on diagnostic imaging of heart and coronary circulation: Secondary | ICD-10-CM | POA: Diagnosis present

## 2022-06-29 NOTE — Telephone Encounter (Signed)
The patient stated that nothing is needed. HE has an appointment with Almyra Deforest, PA tomorrow to discuss the cath due to the cardiac ct results.

## 2022-06-29 NOTE — Telephone Encounter (Signed)
New Message:      Patient appointment for tomorrow(06-30-22)  with Patrick Noble had to be cancelled. Patient says he needs a Cath, he wants to know if can have his Cath this week.

## 2022-06-29 NOTE — Progress Notes (Deleted)
Cardiology Office Note:    Date:  06/29/2022   ID:  Patrick Balboa., DOB 01-Apr-1951, MRN [id number]  PCP:  Hulan Fess, MD   [place] Providers Cardiologist:  None { Click to update primary MD,subspecialty MD or APP then REFRESH:1}    Referring MD: Hulan Fess, MD   No chief complaint on file. ***  History of Present Illness:    Patrick Lindroth. is a 71 y.o. male with a hx of ***  Past Medical History:  Diagnosis Date   Degenerative arthritis of lumbar spine    Hypertension    Kidney stone    Prostate pain    Scoliosis     Past Surgical History:  Procedure Laterality Date   CHOLECYSTECTOMY     injections in the back     KNEE ARTHROSCOPY      Current Medications: No outpatient medications have been marked as taking for the 06/30/22 encounter (Appointment) with Ledora Bottcher, [place].     Allergies:   Fluoxetine hcl   Social History   Socioeconomic History   Marital status: Married    Spouse name: Not on file   Number of children: Not on file   Years of education: Not on file   Highest education level: Not on file  Occupational History   Not on file  Tobacco Use   Smoking status: Former   Smokeless tobacco: Never  Substance and Sexual Activity   Alcohol use: Yes   Drug use: No   Sexual activity: Not on file  Other Topics Concern   Not on file  Social History Narrative   Not on file   Social Determinants of Health   Financial Resource Strain: Not on file  Food Insecurity: Not on file  Transportation Needs: Not on file  Physical Activity: Not on file  Stress: Not on file  Social Connections: Not on file     Family History: The patient's ***family history includes Neuropathy in his father.  ROS:   Please see the history of present illness.    *** All other systems reviewed and are negative.  EKGs/Labs/Other Studies Reviewed:    The following studies were reviewed today: ***  EKG:  EKG is *** ordered today.  The ekg ordered  today demonstrates ***  Recent Labs: 06/09/2022: BUN 19; Creatinine, Ser 1.14; Potassium 4.6; Sodium 140  Recent Lipid Panel No results found for: "CHOL", "TRIG", "HDL", "CHOLHDL", "VLDL", "LDLCALC", "LDLDIRECT"   Risk Assessment/Calculations:   {Does this patient have ATRIAL FIBRILLATION?[phone]}            Physical Exam:    VS:  There were no vitals taken for this visit.    Wt Readings from Last 3 Encounters:  06/09/22 212 lb (96.2 kg)  06/30/21 201 lb (91.2 kg)  05/29/19 208 lb 12.8 oz (94.7 kg)     GEN: *** Well nourished, well developed in no acute distress HEENT: Normal NECK: No JVD; No carotid bruits LYMPHATICS: No lymphadenopathy CARDIAC: ***RRR, no murmurs, rubs, gallops RESPIRATORY:  Clear to auscultation without rales, wheezing or rhonchi  ABDOMEN: Soft, non-tender, non-distended MUSCULOSKELETAL:  No edema; No deformity  SKIN: Warm and dry NEUROLOGIC:  Alert and oriented x 3 PSYCHIATRIC:  Normal affect   ASSESSMENT:    No diagnosis found. PLAN:    In order of problems listed above:  ***      {Are you ordering a CV Procedure (e.g. stress test, cath, DCCV, TEE, etc)?   Press  F2        :[id number]}    Medication Adjustments/Labs and Tests Ordered: Current medicines are reviewed at length with the patient today.  Concerns regarding medicines are outlined above.  No orders of the defined types were placed in this encounter.  No orders of the defined types were placed in this encounter.   There are no Patient Instructions on file for this visit.   Signed, Ledora Bottcher, [place]  06/29/2022 3:35 PM    [facility]

## 2022-06-30 ENCOUNTER — Ambulatory Visit: Payer: PPO | Attending: Physician Assistant | Admitting: Physician Assistant

## 2022-06-30 ENCOUNTER — Ambulatory Visit: Payer: PPO | Admitting: Physician Assistant

## 2022-06-30 ENCOUNTER — Ambulatory Visit: Payer: PPO | Admitting: Nurse Practitioner

## 2022-06-30 ENCOUNTER — Encounter: Payer: Self-pay | Admitting: Physician Assistant

## 2022-06-30 ENCOUNTER — Other Ambulatory Visit: Payer: Self-pay | Admitting: Physician Assistant

## 2022-06-30 VITALS — BP 156/86 | HR 81 | Ht 76.0 in | Wt 209.8 lb

## 2022-06-30 DIAGNOSIS — I25118 Atherosclerotic heart disease of native coronary artery with other forms of angina pectoris: Secondary | ICD-10-CM | POA: Diagnosis not present

## 2022-06-30 DIAGNOSIS — E785 Hyperlipidemia, unspecified: Secondary | ICD-10-CM | POA: Diagnosis not present

## 2022-06-30 DIAGNOSIS — R931 Abnormal findings on diagnostic imaging of heart and coronary circulation: Secondary | ICD-10-CM

## 2022-06-30 DIAGNOSIS — Z01818 Encounter for other preprocedural examination: Secondary | ICD-10-CM | POA: Diagnosis not present

## 2022-06-30 LAB — CBC
Hematocrit: 42.3 % (ref 37.5–51.0)
Hemoglobin: 13.6 g/dL (ref 13.0–17.7)
MCH: 21.7 pg — ABNORMAL LOW (ref 26.6–33.0)
MCHC: 32.2 g/dL (ref 31.5–35.7)
MCV: 67 fL — ABNORMAL LOW (ref 79–97)
Platelets: 276 10*3/uL (ref 150–450)
RBC: 6.28 x10E6/uL — ABNORMAL HIGH (ref 4.14–5.80)
RDW: 16.7 % — ABNORMAL HIGH (ref 11.6–15.4)
WBC: 8.6 10*3/uL (ref 3.4–10.8)

## 2022-06-30 LAB — COMPREHENSIVE METABOLIC PANEL
ALT: 29 IU/L (ref 0–44)
AST: 24 IU/L (ref 0–40)
Albumin/Globulin Ratio: 2.3 — ABNORMAL HIGH (ref 1.2–2.2)
Albumin: 5.4 g/dL — ABNORMAL HIGH (ref 3.9–4.9)
Alkaline Phosphatase: 119 IU/L (ref 44–121)
BUN/Creatinine Ratio: 19 (ref 10–24)
BUN: 21 mg/dL (ref 8–27)
Bilirubin Total: 0.8 mg/dL (ref 0.0–1.2)
CO2: 28 mmol/L (ref 20–29)
Calcium: 10.5 mg/dL — ABNORMAL HIGH (ref 8.6–10.2)
Chloride: 103 mmol/L (ref 96–106)
Creatinine, Ser: 1.12 mg/dL (ref 0.76–1.27)
Globulin, Total: 2.4 g/dL (ref 1.5–4.5)
Glucose: 126 mg/dL — ABNORMAL HIGH (ref 70–99)
Potassium: 5.2 mmol/L (ref 3.5–5.2)
Sodium: 141 mmol/L (ref 134–144)
Total Protein: 7.8 g/dL (ref 6.0–8.5)
eGFR: 72 mL/min/{1.73_m2} (ref >59)

## 2022-06-30 MED ORDER — METOPROLOL TARTRATE 25 MG PO TABS: 12.5000 mg | ORAL_TABLET | Freq: Two times a day (BID) | ORAL | 3 refills | Status: DC

## 2022-06-30 MED ORDER — SODIUM CHLORIDE 0.9% FLUSH: 3.0000 mL | Freq: Two times a day (BID) | INTRAVENOUS | Status: DC

## 2022-06-30 MED ORDER — NITROGLYCERIN 0.4 MG SL SUBL: 0.4000 mg | SUBLINGUAL_TABLET | SUBLINGUAL | 2 refills | Status: AC | PRN

## 2022-06-30 MED ORDER — ASPIRIN 81 MG PO TBEC: 81.0000 mg | DELAYED_RELEASE_TABLET | Freq: Every day | ORAL | 3 refills | Status: AC

## 2022-06-30 MED ORDER — ATORVASTATIN CALCIUM 80 MG PO TABS: 80.0000 mg | ORAL_TABLET | Freq: Every day | ORAL | 3 refills | Status: DC

## 2022-06-30 NOTE — Progress Notes (Signed)
Cardiology Office Note:    Date:  06/30/2022   ID:  Patrick Balboa., DOB [birth year], MRN [id number]  PCP:  Hulan Fess, MD   [place] Providers Cardiologist:  Pixie Casino, MD     Referring MD: Hulan Fess, MD   Chief Complaint  Patient presents with   Follow-up    Abnormal coronary CT    History of Present Illness:    Patrick Malinowski. is a 71 y.o. male with a hx of former tobacco use quit 37 years ago, hypertension (not on meds), low back pain and history of scoliosis.  Patient was previously on lisinopril, however this medication was later stopped after blood pressure normalized.  He was also on Lovaza in the past however no longer on it.  Patient was referred by his PCP to see Dr. Debara Pickett on 06/09/2022 for evaluation of shortness of breath with exertion and also chest discomfort.  Blood pressure was normal on that day.  Blood work obtained on the same day showed a creatinine 1.14.  Nonfasting glucose was 233.  Vitamin D was low, he was placed on vitamin D supplement.  Coronary CT obtained on 06/28/2022 showed 0 to 24% left main disease, 50 to 69% proximal LAD lesion, 25 to 49% mid LAD lesion, 0 to 24% D1 lesion, 0 to 24% proximal left circumflex lesion, 70 to 99% mid left circumflex lesion.  Subsequent FFR showed hemodynamically significant lesion in the proximal LAD, mid left circumflex artery and CTO of the proximal RCA.  The results are suggestive of triple-vessel disease.  Cardiac catheterization was recommended.  Patient presents today for follow-up along with his wife.  His biggest complaint for the past year is in the exertional fatigue.  He mentions that he feels drained with minimal activity.  He also feels more short of breath with activity with intermittent chest pain.  His father had MI in his 70s.  His father also has prostate cancer however passed away due to COPD and pneumonia.  Mother had pancreatic cancer.  He has chronic low back pain, however he does notice  occasional chest discomfort when he walks his dog.  He has not had any persistent chest pain since the last visit.  We discussed his recent coronary CT and FFR report.  He is aware that Dr. Debara Pickett recommended proceed with cardiac catheterization.  Wife mentions she saw some jaundice in the white of his eye last night.  I will order a stat CMP and CBC.  Patient was previously scheduled to go to [place] from Sunday until next Thursday.  He is aware of benefit and risk of the cardiac catheterization and the wish to find out the answer soon.  He is also aware that with triple-vessel disease, there is a chance our interventional cardiologist may recommend bypass surgery instead of stenting, if that is the case, they will have to reschedule their trip to [place].  Ideally he should have an echocardiogram, this can be ordered in the hospital when he does show up for cardiac cath.  I have decided to start him on aspirin, Lipitor and low-dose metoprolol.  His blood pressure is high today, however normally his blood pressure is in the 120s.  He seems to be very anxious and nervous.  He takes Klonopin at home for anxiety.  I also give him a prescription for sublingual nitroglycerin.  Past Medical History:  Diagnosis Date   Degenerative arthritis of lumbar spine    Hypertension  Kidney stone    Prostate pain    Scoliosis     Past Surgical History:  Procedure Laterality Date   CHOLECYSTECTOMY     injections in the back     KNEE ARTHROSCOPY      Current Medications: Current Meds  Medication Sig   aspirin EC 81 MG tablet Take 1 tablet (81 mg total) by mouth daily. Swallow whole.   atorvastatin (LIPITOR) 80 MG tablet Take 1 tablet (80 mg total) by mouth daily.   clonazePAM (KLONOPIN) 0.5 MG tablet clonazepam 0.5 mg tablet   HYDROcodone-acetaminophen (NORCO/VICODIN) 5-325 MG tablet Take 0.5-1 tablets by mouth every 6 (six) hours as needed for moderate pain.    hydrocortisone (ANUSOL-HC) 2.5 % rectal  cream Apply topically 2 (two) times daily.   Ibuprofen (ADVIL PO) Take by mouth as needed.   loratadine (CLARITIN) 10 MG tablet Take 10 mg by mouth daily as needed for allergies.    metoprolol tartrate (LOPRESSOR) 25 MG tablet Take 0.5 tablets (12.5 mg total) by mouth 2 (two) times daily.   Multiple Vitamins-Minerals (PRESERVISION AREDS 2 PO) Take 1 tablet by mouth 2 (two) times daily.   nitroGLYCERIN (NITROSTAT) 0.4 MG SL tablet Place 1 tablet (0.4 mg total) under the tongue every 5 (five) minutes as needed for chest pain.   PARoxetine (PAXIL) 40 MG tablet Take 40 mg by mouth at bedtime.    Vitamin D, Ergocalciferol, (DRISDOL) 1.25 MG (50000 UNIT) CAPS capsule Take 1 capsule (50,000 Units total) by mouth every 7 (seven) days. Take for 4 weeks then start OTC vitamin D 1000 IU daily   Wheat Dextrin (BENEFIBER DRINK MIX PO) Take by mouth daily.   [DISCONTINUED] acetaminophen (TYLENOL) 325 MG tablet Take by mouth.     Allergies:   Fluoxetine hcl   Social History   Socioeconomic History   Marital status: Married    Spouse name: Not on file   Number of children: Not on file   Years of education: Not on file   Highest education level: Not on file  Occupational History   Not on file  Tobacco Use   Smoking status: Former   Smokeless tobacco: Never  Substance and Sexual Activity   Alcohol use: Yes   Drug use: No   Sexual activity: Not on file  Other Topics Concern   Not on file  Social History Narrative   Not on file   Social Determinants of Health   Financial Resource Strain: Not on file  Food Insecurity: Not on file  Transportation Needs: Not on file  Physical Activity: Not on file  Stress: Not on file  Social Connections: Not on file     Family History: The patient's family history includes Cancer in his father and mother; Heart attack in his father; Neuropathy in his father.  ROS:   Please see the history of present illness.     All other systems reviewed and are  negative.  EKGs/Labs/Other Studies Reviewed:    The following studies were reviewed today:  Coronary CT 06/28/2022 Coronary Arteries:  Normal coronary origin.  Right dominance.   RCA is a large dominant artery that gives rise to PDA and PLA. CTO of proximal RCA   Left main is a large artery that gives rise to LAD and LCX arteries. Calcified plaque in left main causes 0-24% stenosis.   LAD is a large vessel. Noncalcified plaque in proximal LAD causes 50-69% stenosis. Mixed plaque in mid LAD causes 25-49% stenosis. Calcified plaque in  D1 causes 0-24% stenosis   LCX is a non-dominant artery that gives rise to one large OM1 branch. Calcified plaque in proximal LCX causes 0-24% stenosis. Noncalcified plaque in mid LCX causes severe (70-99%) stenosis.   Other findings:   Left Ventricle: Normal size   Left Atrium: Normal size   Pulmonary Veins: Normal configuration   Right Ventricle: Normal size   Right Atrium: Normal size   Cardiac valves: No calcifications   Thoracic aorta: Normal size   Pulmonary Arteries: Normal size   Systemic Veins: Normal drainage   Pericardium: Normal thickness   IMPRESSION: 1. Coronary calcium score of 135. This was 52nd percentile for age and sex matched control.   2.  Normal coronary origin with right dominance.   3.  Obstructive CAD   4.  Proximal RCA CTO   5.  Noncalcified plaque in mid LCX causes severe (70-99%) stenosis   6. Noncalcified plaque in proximal LAD causes moderate (50-69%) stenosis. Will send for CTFFR   7.  Cardiac catheterization recommended   FFR 06/28/2022 1. Left Main: No significant stenosis   2. LAD: CTFFR 0.73 across lesion in proximal LAD, suggesting lesion is hemodynamically significant   3. LCX: CTFR 0.54 across lesion in mid LCX, suggesting lesion is hemodynamically significant   4. RCA: CTO proximal RCA   IMPRESSION: 1. CTFFR suggests severe triple vessel CAD. Cardiac  catheterization recommended  EKG:  EKG is not ordered today.  Last EKG obtained on 06/09/2022 showed normal sinus rhythm, no signs of ST-T wave changes  Recent Labs: 06/09/2022: BUN 19; Creatinine, Ser 1.14; Potassium 4.6; Sodium 140  Recent Lipid Panel No results found for: "CHOL", "TRIG", "HDL", "CHOLHDL", "VLDL", "LDLCALC", "LDLDIRECT"   Risk Assessment/Calculations:           Physical Exam:    VS:  BP (!) 156/86   Pulse 81   Ht '6\' 4"'$  (1.93 m)   Wt 209 lb 12.8 oz (95.2 kg)   SpO2 97%   BMI 25.54 kg/m        Wt Readings from Last 3 Encounters:  06/30/22 209 lb 12.8 oz (95.2 kg)  06/09/22 212 lb (96.2 kg)  06/30/21 201 lb (91.2 kg)     GEN:  Well nourished, well developed in no acute distress HEENT: Normal NECK: No JVD; No carotid bruits LYMPHATICS: No lymphadenopathy CARDIAC: RRR, no murmurs, rubs, gallops RESPIRATORY:  Clear to auscultation without rales, wheezing or rhonchi  ABDOMEN: Soft, non-tender, non-distended MUSCULOSKELETAL:  No edema; No deformity  SKIN: Warm and dry NEUROLOGIC:  Alert and oriented x 3 PSYCHIATRIC:  Normal affect   ASSESSMENT:    1. Abnormal findings diagnostic imaging of heart and coronary circulation   2. Preprocedural examination   3. Hyperlipidemia LDL goal <70   4. Coronary artery disease of native artery of native heart with stable angina pectoris (HCC)    PLAN:    In order of problems listed above:  Abnormal coronary CT: Patient has been having exertional fatigue, shortness of breath and exertional chest discomfort lately.  He was seen by Dr. Debara Pickett who ordered a coronary CT which revealed CTO of proximal RCA, hemodynamically significant lesion in proximal LAD and mid left circumflex artery.  Essentially the patient has triple-vessel disease.  Dr. Debara Pickett recommended proceed with cardiac catheterization.  I discussed the risk and benefit of cardiac catheterization and reviewed the recent coronary CT study with both the patient  and his wife.  We will proceed to schedule him with cardiac  catheterization this week.  When he is in the hospital, ideally he should also have echocardiogram as well.  He was originally planned to go to [place] this Sunday until next Thursday for fun, however he is aware that if interventional cardiologist recommended bypass surgery, this trip will need to be delayed.  Hyperlipidemia: Started on Lipitor 80 mg daily.  Wife was concerned that his eye looks jaundiced, we will obtain a CMP  CAD: Given the need for cardiac catheterization, will need stat CBC and CMP today.  Start on aspirin, Lipitor and low-dose metoprolol.  Although blood pressure is elevated today, normally his blood pressure is in the 120s without any medication.  Ideally will need echocardiogram to be done in the hospital.      Shared Decision Making/Informed Consent The risks [stroke (1 in 1000), death (1 in 1000), kidney failure [usually temporary] (1 in 500), bleeding (1 in 200), allergic reaction [possibly serious] (1 in 200)], benefits (diagnostic support and management of coronary artery disease) and alternatives of a cardiac catheterization were discussed in detail with Patrick Noble and he is willing to proceed.    Medication Adjustments/Labs and Tests Ordered: Current medicines are reviewed at length with the patient today.  Concerns regarding medicines are outlined above.  Orders Placed This Encounter  Procedures   CBC   Comprehensive metabolic panel   Meds ordered this encounter  Medications   aspirin EC 81 MG tablet    Sig: Take 1 tablet (81 mg total) by mouth daily. Swallow whole.    Dispense:  90 tablet    Refill:  3   atorvastatin (LIPITOR) 80 MG tablet    Sig: Take 1 tablet (80 mg total) by mouth daily.    Dispense:  90 tablet    Refill:  3   metoprolol tartrate (LOPRESSOR) 25 MG tablet    Sig: Take 0.5 tablets (12.5 mg total) by mouth 2 (two) times daily.    Dispense:  90 tablet    Refill:  3    nitroGLYCERIN (NITROSTAT) 0.4 MG SL tablet    Sig: Place 1 tablet (0.4 mg total) under the tongue every 5 (five) minutes as needed for chest pain.    Dispense:  30 tablet    Refill:  2    Patient Instructions  Medication Instructions:  START ASA 81 mg daily  START Atorvastatin (Lipitor) 80 mg daily  START Metoprolol Tartrate (Lopressor) 12.5 mg 2 times a day  START Nitroglycerin (Nitro) 0.4 mg as needed for chest pain. DO NOT USE MORE THAN 3 tablets in an episode  *If you need a refill on your cardiac medications before your next appointment, please call your pharmacy*  Lab Work: Your physician recommends that you return for lab work TODAY:  CBC CMP  If you have labs (blood work) drawn today and your tests are completely normal, you will receive your results only by: [place] (if you have MyChart) OR A paper copy in the mail If you have any lab test that is abnormal or we need to change your treatment, we will call you to review the results.  Testing/Procedures: Your physician has requested that you have a cardiac catheterization. Cardiac catheterization is used to diagnose and/or treat various heart conditions. Doctors may recommend this procedure for a number of different reasons. The most common reason is to evaluate chest pain. Chest pain can be a symptom of coronary artery disease (CAD), and cardiac catheterization can show whether plaque is narrowing or blocking your  heart's arteries. This procedure is also used to evaluate the valves, as well as measure the blood flow and oxygen levels in different parts of your heart. For further information please visit HugeFiesta.tn. Please follow instruction sheet, as given.  Scheduled for Thursday 07/01/22 at 9:00 AM with Dr. Angelena Form   Follow-Up: At [facility], you and your health needs are our priority.  As part of our continuing mission to provide you with exceptional heart care, we have created designated Provider  Care Teams.  These Care Teams include your primary Cardiologist (physician) and Advanced Practice Providers (APPs -  Physician Assistants and Nurse Practitioners) who all work together to provide you with the care you need, when you need it.  Your next appointment:   3-4 week(s) after Heart Cath   The format for your next appointment:   In Person  Provider:   Almyra Deforest, PA-C        Other Instructions    Cardiac/Peripheral Catheterization   You are scheduled for a Cardiac Catheterization on Thursday, September 21 with Dr. Lauree Chandler.  1. Please arrive at the Main Entrance A at [facility]: [place], [place] [address] on September 21 at 7:00 AM (This time is two hours before your procedure to ensure your preparation). Free valet parking service is available. You will check in at ADMITTING. The support person will be asked to wait in the waiting room.  It is OK to have someone drop you off and come back when you are ready to be discharged.        Special note: Every effort is made to have your procedure done on time. Please understand that emergencies sometimes delay scheduled procedures.   . 2. Diet: Do not eat solid foods after midnight.  You may have clear liquids until 5 AM the day of the procedure.  3. Labs: You will need to have blood drawn on Wednesday, September 20 at [facility]. [address]  Open: 7:30am - 5pm    Phone: [phone]. You do not need to be fasting.  4. Medication instructions in preparation for your procedure:   Contrast Allergy: No  On the morning of your procedure, take Aspirin 81 mg and any morning medicines NOT listed above.  You may use sips of water.  5. Plan to go home the same day, you will only stay overnight if medically necessary. 6. You MUST have a responsible adult to drive you home. 7. An adult MUST be with you the first 24 hours after you arrive home. 8. Bring  a current list of your medications, and the last time and date medication taken. 9. Bring ID and current insurance cards. 10.Please wear clothes that are easy to get on and off and wear slip-on shoes.  Thank you for allowing [place] to care for you!   -- [facility] Invasive Cardiovascular services   Important Information About Sugar         Hilbert Corrigan, [place]  06/30/2022 12:23 PM    [place]

## 2022-06-30 NOTE — Patient Instructions (Addendum)
Medication Instructions:  START ASA 81 mg daily  START Atorvastatin (Lipitor) 80 mg daily  START Metoprolol Tartrate (Lopressor) 12.5 mg 2 times a day  START Nitroglycerin (Nitro) 0.4 mg as needed for chest pain. DO NOT USE MORE THAN 3 tablets in an episode  *If you need a refill on your cardiac medications before your next appointment, please call your pharmacy*  Lab Work: Your physician recommends that you return for lab work TODAY:  CBC CMP  If you have labs (blood work) drawn today and your tests are completely normal, you will receive your results only by: [place] (if you have MyChart) OR A paper copy in the mail If you have any lab test that is abnormal or we need to change your treatment, we will call you to review the results.  Testing/Procedures: Your physician has requested that you have a cardiac catheterization. Cardiac catheterization is used to diagnose and/or treat various heart conditions. Doctors may recommend this procedure for a number of different reasons. The most common reason is to evaluate chest pain. Chest pain can be a symptom of coronary artery disease (CAD), and cardiac catheterization can show whether plaque is narrowing or blocking your heart's arteries. This procedure is also used to evaluate the valves, as well as measure the blood flow and oxygen levels in different parts of your heart. For further information please visit HugeFiesta.tn. Please follow instruction sheet, as given.  Scheduled for Thursday 07/01/22 at 9:00 AM with Dr. Angelena Form   Follow-Up: At [facility], you and your health needs are our priority.  As part of our continuing mission to provide you with exceptional heart care, we have created designated Provider Care Teams.  These Care Teams include your primary Cardiologist (physician) and Advanced Practice Providers (APPs -  Physician Assistants and Nurse Practitioners) who all work together to provide you with the care  you need, when you need it.  Your next appointment:   3-4 week(s) after Heart Cath   The format for your next appointment:   In Person  Provider:   Almyra Deforest, PA-C        Other Instructions    Cardiac/Peripheral Catheterization   You are scheduled for a Cardiac Catheterization on Thursday, September 21 with Dr. Lauree Chandler.  1. Please arrive at the Main Entrance A at [facility]: [place], [place] [address] on September 21 at 7:00 AM (This time is two hours before your procedure to ensure your preparation). Free valet parking service is available. You will check in at ADMITTING. The support person will be asked to wait in the waiting room.  It is OK to have someone drop you off and come back when you are ready to be discharged.        Special note: Every effort is made to have your procedure done on time. Please understand that emergencies sometimes delay scheduled procedures.   . 2. Diet: Do not eat solid foods after midnight.  You may have clear liquids until 5 AM the day of the procedure.  3. Labs: You will need to have blood drawn on Wednesday, September 20 at [facility] at [facility]. [address]  Open: 7:30am - 5pm    Phone: [phone]. You do not need to be fasting.  4. Medication instructions in preparation for your procedure:   Contrast Allergy: No  On the morning of your procedure, take Aspirin 81 mg and any morning  medicines NOT listed above.  You may use sips of water.  5. Plan to go home the same day, you will only stay overnight if medically necessary. 6. You MUST have a responsible adult to drive you home. 7. An adult MUST be with you the first 24 hours after you arrive home. 8. Bring a current list of your medications, and the last time and date medication taken. 9. Bring ID and current insurance cards. 10.Please wear clothes that are easy to get on and off and wear slip-on shoes.  Thank  you for allowing [place] to care for you!   -- [place] Invasive Cardiovascular services   Important Information About Sugar

## 2022-06-30 NOTE — H&P (View-Only) (Signed)
Cardiology Office Note:    Date:  06/30/2022   ID:  Patrick Balboa., DOB 1950/12/25, MRN [id number]  PCP:  Hulan Fess, MD   [place] [facility] Cardiologist:  Pixie Casino, MD     Referring MD: Hulan Fess, MD   Chief Complaint  Patient presents with   Follow-up    Abnormal coronary CT    History of Present Illness:    Patrick Krakow. is a 71 y.o. male with a hx of former tobacco use quit 37 years ago, hypertension (not on meds), low back pain and history of scoliosis.  Patient was previously on lisinopril, however this medication was later stopped after blood pressure normalized.  He was also on Lovaza in the past however no longer on it.  Patient was referred by his PCP to see Dr. Debara Pickett on 06/09/2022 for evaluation of shortness of breath with exertion and also chest discomfort.  Blood pressure was normal on that day.  Blood work obtained on the same day showed a creatinine 1.14.  Nonfasting glucose was 233.  Vitamin D was low, he was placed on vitamin D supplement.  Coronary CT obtained on 06/28/2022 showed 0 to 24% left main disease, 50 to 69% proximal LAD lesion, 25 to 49% mid LAD lesion, 0 to 24% D1 lesion, 0 to 24% proximal left circumflex lesion, 70 to 99% mid left circumflex lesion.  Subsequent FFR showed hemodynamically significant lesion in the proximal LAD, mid left circumflex artery and CTO of the proximal RCA.  The results are suggestive of triple-vessel disease.  Cardiac catheterization was recommended.  Patient presents today for follow-up along with his wife.  His biggest complaint for the past year is in the exertional fatigue.  He mentions that he feels drained with minimal activity.  He also feels more short of breath with activity with intermittent chest pain.  His father had MI in his 83s.  His father also has prostate cancer however passed away due to COPD and pneumonia.  Mother had pancreatic cancer.  He has chronic low back pain, however he does notice  occasional chest discomfort when he walks his dog.  He has not had any persistent chest pain since the last visit.  We discussed his recent coronary CT and FFR report.  He is aware that Dr. Debara Pickett recommended proceed with cardiac catheterization.  Wife mentions she saw some jaundice in the white of his eye last night.  I will order a stat CMP and CBC.  Patient was previously scheduled to go to [place] from Sunday until next Thursday.  He is aware of benefit and risk of the cardiac catheterization and the wish to find out the answer soon.  He is also aware that with triple-vessel disease, there is a chance our interventional cardiologist may recommend bypass surgery instead of stenting, if that is the case, they will have to reschedule their trip to [place].  Ideally he should have an echocardiogram, this can be ordered in the hospital when he does show up for cardiac cath.  I have decided to start him on aspirin, Lipitor and low-dose metoprolol.  His blood pressure is high today, however normally his blood pressure is in the 120s.  He seems to be very anxious and nervous.  He takes Klonopin at home for anxiety.  I also give him a prescription for sublingual nitroglycerin.  Past Medical History:  Diagnosis Date   Degenerative arthritis of lumbar spine    Hypertension  Kidney stone    Prostate pain    Scoliosis     Past Surgical History:  Procedure Laterality Date   CHOLECYSTECTOMY     injections in the back     KNEE ARTHROSCOPY      Current Medications: Current Meds  Medication Sig   aspirin EC 81 MG tablet Take 1 tablet (81 mg total) by mouth daily. Swallow whole.   atorvastatin (LIPITOR) 80 MG tablet Take 1 tablet (80 mg total) by mouth daily.   clonazePAM (KLONOPIN) 0.5 MG tablet clonazepam 0.5 mg tablet   HYDROcodone-acetaminophen (NORCO/VICODIN) 5-325 MG tablet Take 0.5-1 tablets by mouth every 6 (six) hours as needed for moderate pain.    hydrocortisone (ANUSOL-HC) 2.5 % rectal  cream Apply topically 2 (two) times daily.   Ibuprofen (ADVIL PO) Take by mouth as needed.   loratadine (CLARITIN) 10 MG tablet Take 10 mg by mouth daily as needed for allergies.    metoprolol tartrate (LOPRESSOR) 25 MG tablet Take 0.5 tablets (12.5 mg total) by mouth 2 (two) times daily.   Multiple Vitamins-Minerals (PRESERVISION AREDS 2 PO) Take 1 tablet by mouth 2 (two) times daily.   nitroGLYCERIN (NITROSTAT) 0.4 MG SL tablet Place 1 tablet (0.4 mg total) under the tongue every 5 (five) minutes as needed for chest pain.   PARoxetine (PAXIL) 40 MG tablet Take 40 mg by mouth at bedtime.    Vitamin D, Ergocalciferol, (DRISDOL) 1.25 MG (50000 UNIT) CAPS capsule Take 1 capsule (50,000 Units total) by mouth every 7 (seven) days. Take for 4 weeks then start OTC vitamin D 1000 IU daily   Wheat Dextrin (BENEFIBER DRINK MIX PO) Take by mouth daily.   [DISCONTINUED] acetaminophen (TYLENOL) 325 MG tablet Take by mouth.     Allergies:   Fluoxetine hcl   Social History   Socioeconomic History   Marital status: Married    Spouse name: Not on file   Number of children: Not on file   Years of education: Not on file   Highest education level: Not on file  Occupational History   Not on file  Tobacco Use   Smoking status: Former   Smokeless tobacco: Never  Substance and Sexual Activity   Alcohol use: Yes   Drug use: No   Sexual activity: Not on file  Other Topics Concern   Not on file  Social History Narrative   Not on file   Social Determinants of Health   Financial Resource Strain: Not on file  Food Insecurity: Not on file  Transportation Needs: Not on file  Physical Activity: Not on file  Stress: Not on file  Social Connections: Not on file     Family History: The patient's family history includes Cancer in his father and mother; Heart attack in his father; Neuropathy in his father.  ROS:   Please see the history of present illness.     All other systems reviewed and are  negative.  EKGs/Labs/Other Studies Reviewed:    The following studies were reviewed today:  Coronary CT 06/28/2022 Coronary Arteries:  Normal coronary origin.  Right dominance.   RCA is a large dominant artery that gives rise to PDA and PLA. CTO of proximal RCA   Left main is a large artery that gives rise to LAD and LCX arteries. Calcified plaque in left main causes 0-24% stenosis.   LAD is a large vessel. Noncalcified plaque in proximal LAD causes 50-69% stenosis. Mixed plaque in mid LAD causes 25-49% stenosis. Calcified plaque in  D1 causes 0-24% stenosis   LCX is a non-dominant artery that gives rise to one large OM1 branch. Calcified plaque in proximal LCX causes 0-24% stenosis. Noncalcified plaque in mid LCX causes severe (70-99%) stenosis.   Other findings:   Left Ventricle: Normal size   Left Atrium: Normal size   Pulmonary Veins: Normal configuration   Right Ventricle: Normal size   Right Atrium: Normal size   Cardiac valves: No calcifications   Thoracic aorta: Normal size   Pulmonary Arteries: Normal size   Systemic Veins: Normal drainage   Pericardium: Normal thickness   IMPRESSION: 1. Coronary calcium score of 135. This was 52nd percentile for age and sex matched control.   2.  Normal coronary origin with right dominance.   3.  Obstructive CAD   4.  Proximal RCA CTO   5.  Noncalcified plaque in mid LCX causes severe (70-99%) stenosis   6. Noncalcified plaque in proximal LAD causes moderate (50-69%) stenosis. Will send for CTFFR   7.  Cardiac catheterization recommended   FFR 06/28/2022 1. Left Main: No significant stenosis   2. LAD: CTFFR 0.73 across lesion in proximal LAD, suggesting lesion is hemodynamically significant   3. LCX: CTFR 0.54 across lesion in mid LCX, suggesting lesion is hemodynamically significant   4. RCA: CTO proximal RCA   IMPRESSION: 1. CTFFR suggests severe triple vessel CAD. Cardiac  catheterization recommended  EKG:  EKG is not ordered today.  Last EKG obtained on 06/09/2022 showed normal sinus rhythm, no signs of ST-T wave changes  Recent Labs: 06/09/2022: BUN 19; Creatinine, Ser 1.14; Potassium 4.6; Sodium 140  Recent Lipid Panel No results found for: "CHOL", "TRIG", "HDL", "CHOLHDL", "VLDL", "LDLCALC", "LDLDIRECT"   Risk Assessment/Calculations:           Physical Exam:    VS:  BP (!) 156/86   Pulse 81   Ht '6\' 4"'$  (1.93 m)   Wt 209 lb 12.8 oz (95.2 kg)   SpO2 97%   BMI 25.54 kg/m        Wt Readings from Last 3 Encounters:  06/30/22 209 lb 12.8 oz (95.2 kg)  06/09/22 212 lb (96.2 kg)  06/30/21 201 lb (91.2 kg)     GEN:  Well nourished, well developed in no acute distress HEENT: Normal NECK: No JVD; No carotid bruits LYMPHATICS: No lymphadenopathy CARDIAC: RRR, no murmurs, rubs, gallops RESPIRATORY:  Clear to auscultation without rales, wheezing or rhonchi  ABDOMEN: Soft, non-tender, non-distended MUSCULOSKELETAL:  No edema; No deformity  SKIN: Warm and dry NEUROLOGIC:  Alert and oriented x 3 PSYCHIATRIC:  Normal affect   ASSESSMENT:    1. Abnormal findings diagnostic imaging of heart and coronary circulation   2. Preprocedural examination   3. Hyperlipidemia LDL goal <70   4. Coronary artery disease of native artery of native heart with stable angina pectoris (HCC)    PLAN:    In order of problems listed above:  Abnormal coronary CT: Patient has been having exertional fatigue, shortness of breath and exertional chest discomfort lately.  He was seen by Dr. Debara Pickett who ordered a coronary CT which revealed CTO of proximal RCA, hemodynamically significant lesion in proximal LAD and mid left circumflex artery.  Essentially the patient has triple-vessel disease.  Dr. Debara Pickett recommended proceed with cardiac catheterization.  I discussed the risk and benefit of cardiac catheterization and reviewed the recent coronary CT study with both the patient  and his wife.  We will proceed to schedule him with cardiac  catheterization this week.  When he is in the hospital, ideally he should also have echocardiogram as well.  He was originally planned to go to [place] this Sunday until next Thursday for fun, however he is aware that if interventional cardiologist recommended bypass surgery, this trip will need to be delayed.  Hyperlipidemia: Started on Lipitor 80 mg daily.  Wife was concerned that his eye looks jaundiced, we will obtain a CMP  CAD: Given the need for cardiac catheterization, will need stat CBC and CMP today.  Start on aspirin, Lipitor and low-dose metoprolol.  Although blood pressure is elevated today, normally his blood pressure is in the 120s without any medication.  Ideally will need echocardiogram to be done in the hospital.      Shared Decision Making/Informed Consent The risks [stroke (1 in 1000), death (1 in 1000), kidney failure [usually temporary] (1 in 500), bleeding (1 in 200), allergic reaction [possibly serious] (1 in 200)], benefits (diagnostic support and management of coronary artery disease) and alternatives of a cardiac catheterization were discussed in detail with Patrick Noble and he is willing to proceed.    Medication Adjustments/Labs and Tests Ordered: Current medicines are reviewed at length with the patient today.  Concerns regarding medicines are outlined above.  Orders Placed This Encounter  Procedures   CBC   Comprehensive metabolic panel   Meds ordered this encounter  Medications   aspirin EC 81 MG tablet    Sig: Take 1 tablet (81 mg total) by mouth daily. Swallow whole.    Dispense:  90 tablet    Refill:  3   atorvastatin (LIPITOR) 80 MG tablet    Sig: Take 1 tablet (80 mg total) by mouth daily.    Dispense:  90 tablet    Refill:  3   metoprolol tartrate (LOPRESSOR) 25 MG tablet    Sig: Take 0.5 tablets (12.5 mg total) by mouth 2 (two) times daily.    Dispense:  90 tablet    Refill:  3    nitroGLYCERIN (NITROSTAT) 0.4 MG SL tablet    Sig: Place 1 tablet (0.4 mg total) under the tongue every 5 (five) minutes as needed for chest pain.    Dispense:  30 tablet    Refill:  2    Patient Instructions  Medication Instructions:  START ASA 81 mg daily  START Atorvastatin (Lipitor) 80 mg daily  START Metoprolol Tartrate (Lopressor) 12.5 mg 2 times a day  START Nitroglycerin (Nitro) 0.4 mg as needed for chest pain. DO NOT USE MORE THAN 3 tablets in an episode  *If you need a refill on your cardiac medications before your next appointment, please call your pharmacy*  Lab Work: Your physician recommends that you return for lab work TODAY:  CBC CMP  If you have labs (blood work) drawn today and your tests are completely normal, you will receive your results only by: [place] (if you have MyChart) OR A paper copy in the mail If you have any lab test that is abnormal or we need to change your treatment, we will call you to review the results.  Testing/Procedures: Your physician has requested that you have a cardiac catheterization. Cardiac catheterization is used to diagnose and/or treat various heart conditions. Doctors may recommend this procedure for a number of different reasons. The most common reason is to evaluate chest pain. Chest pain can be a symptom of coronary artery disease (CAD), and cardiac catheterization can show whether plaque is narrowing or blocking your  heart's arteries. This procedure is also used to evaluate the valves, as well as measure the blood flow and oxygen levels in different parts of your heart. For further information please visit [email or web address]. Please follow instruction sheet, as given.  Scheduled for Thursday 07/01/22 at 9:00 AM with Dr. Angelena Form   Follow-Up: At [facility], you and your health needs are our priority.  As part of our continuing mission to provide you with exceptional heart care, we have created designated Provider  Care Teams.  These Care Teams include your primary Cardiologist (physician) and Advanced Practice Providers (APPs -  Physician Assistants and Nurse Practitioners) who all work together to provide you with the care you need, when you need it.  Your next appointment:   3-4 week(s) after Heart Cath   The format for your next appointment:   In Person  Provider:   Almyra Deforest, PA-C        Other Instructions    Cardiac/Peripheral Catheterization   You are scheduled for a Cardiac Catheterization on Thursday, September 21 with Dr. Lauree Chandler.  1. Please arrive at the Main Entrance A at [facility]: [place], [place] [address] on September 21 at 7:00 AM (This time is two hours before your procedure to ensure your preparation). Free valet parking service is available. You will check in at ADMITTING. The support person will be asked to wait in the waiting room.  It is OK to have someone drop you off and come back when you are ready to be discharged.        Special note: Every effort is made to have your procedure done on time. Please understand that emergencies sometimes delay scheduled procedures.   . 2. Diet: Do not eat solid foods after midnight.  You may have clear liquids until 5 AM the day of the procedure.  3. Labs: You will need to have blood drawn on Wednesday, September 20 at [facility] [place] at [facility]. [address]  Open: 7:30am - 5pm    Phone: [phone]. You do not need to be fasting.  4. Medication instructions in preparation for your procedure:   Contrast Allergy: No  On the morning of your procedure, take Aspirin 81 mg and any morning medicines NOT listed above.  You may use sips of water.  5. Plan to go home the same day, you will only stay overnight if medically necessary. 6. You MUST have a responsible adult to drive you home. 7. An adult MUST be with you the first 24 hours after you arrive home. 8. Bring  a current list of your medications, and the last time and date medication taken. 9. Bring ID and current insurance cards. 10.Please wear clothes that are easy to get on and off and wear slip-on shoes.  Thank you for allowing [place] to care for you!   -- [facility] services   Important Information About Sugar         Hilbert Corrigan, [place]  06/30/2022 12:23 PM    [place]

## 2022-07-01 ENCOUNTER — Ambulatory Visit (HOSPITAL_COMMUNITY)
Admission: RE | Admit: 2022-07-01 | Discharge: 2022-07-01 | Disposition: A | Discharge status: Home / Self Care | Payer: PPO | Attending: Cardiovascular Disease | Admitting: Cardiovascular Disease

## 2022-07-01 ENCOUNTER — Ambulatory Visit (HOSPITAL_COMMUNITY)
Admission: RE | Disposition: A | Discharge status: Home / Self Care | Payer: Self-pay | Source: Home / Self Care | Attending: Cardiovascular Disease

## 2022-07-01 ENCOUNTER — Other Ambulatory Visit (HOSPITAL_COMMUNITY): Payer: Self-pay

## 2022-07-01 ENCOUNTER — Telehealth: Payer: Self-pay | Admitting: Physician Assistant

## 2022-07-01 ENCOUNTER — Other Ambulatory Visit: Payer: Self-pay

## 2022-07-01 ENCOUNTER — Other Ambulatory Visit: Payer: Self-pay | Admitting: Internal Medicine

## 2022-07-01 DIAGNOSIS — Z8042 Family history of malignant neoplasm of prostate: Secondary | ICD-10-CM | POA: Diagnosis not present

## 2022-07-01 DIAGNOSIS — E785 Hyperlipidemia, unspecified: Secondary | ICD-10-CM | POA: Insufficient documentation

## 2022-07-01 DIAGNOSIS — Z955 Presence of coronary angioplasty implant and graft: Secondary | ICD-10-CM

## 2022-07-01 DIAGNOSIS — I200 Unstable angina: Secondary | ICD-10-CM

## 2022-07-01 DIAGNOSIS — Z87891 Personal history of nicotine dependence: Secondary | ICD-10-CM | POA: Diagnosis not present

## 2022-07-01 DIAGNOSIS — Z800 Family history of malignant neoplasm of digestive organs: Secondary | ICD-10-CM | POA: Diagnosis not present

## 2022-07-01 DIAGNOSIS — I2510 Atherosclerotic heart disease of native coronary artery without angina pectoris: Secondary | ICD-10-CM

## 2022-07-01 DIAGNOSIS — I2582 Chronic total occlusion of coronary artery: Secondary | ICD-10-CM | POA: Diagnosis not present

## 2022-07-01 DIAGNOSIS — I25110 Atherosclerotic heart disease of native coronary artery with unstable angina pectoris: Secondary | ICD-10-CM | POA: Diagnosis not present

## 2022-07-01 DIAGNOSIS — Z8249 Family history of ischemic heart disease and other diseases of the circulatory system: Secondary | ICD-10-CM | POA: Insufficient documentation

## 2022-07-01 DIAGNOSIS — Z836 Family history of other diseases of the respiratory system: Secondary | ICD-10-CM | POA: Insufficient documentation

## 2022-07-01 HISTORY — PX: CORONARY STENT INTERVENTION: CATH118234

## 2022-07-01 HISTORY — PX: LEFT HEART CATH AND CORONARY ANGIOGRAPHY: CATH118249

## 2022-07-01 LAB — POCT ACTIVATED CLOTTING TIME: Activated Clotting Time: 281 seconds

## 2022-07-01 SURGERY — LEFT HEART CATH AND CORONARY ANGIOGRAPHY
Anesthesia: LOCAL

## 2022-07-01 MED ORDER — CLOPIDOGREL BISULFATE 75 MG PO TABS: 75.0000 mg | ORAL_TABLET | Freq: Every day | ORAL | Status: DC

## 2022-07-01 MED ORDER — VERAPAMIL HCL 2.5 MG/ML IV SOLN
INTRAVENOUS | Status: AC
  Filled 2022-07-01: qty 2

## 2022-07-01 MED ORDER — HEPARIN SODIUM (PORCINE) 1000 UNIT/ML IJ SOLN
INTRAMUSCULAR | Status: DC | PRN
  Administered 2022-07-01: 5000 [IU] via INTRAVENOUS
  Administered 2022-07-01: 2000 [IU] via INTRAVENOUS
  Administered 2022-07-01: 6000 [IU] via INTRAVENOUS

## 2022-07-01 MED ORDER — HEPARIN SODIUM (PORCINE) 1000 UNIT/ML IJ SOLN
INTRAMUSCULAR | Status: AC
  Filled 2022-07-01: qty 10

## 2022-07-01 MED ORDER — HEPARIN (PORCINE) IN NACL 1000-0.9 UT/500ML-% IV SOLN
INTRAVENOUS | Status: DC | PRN
  Administered 2022-07-01 (×2): 500 mL

## 2022-07-01 MED ORDER — VERAPAMIL HCL 2.5 MG/ML IV SOLN
INTRAVENOUS | Status: DC | PRN
  Administered 2022-07-01: 10 mL via INTRA_ARTERIAL

## 2022-07-01 MED ORDER — SODIUM CHLORIDE 0.9 % IV SOLN: 250.0000 mL | INTRAVENOUS | Status: DC | PRN

## 2022-07-01 MED ORDER — LIDOCAINE HCL (PF) 1 % IJ SOLN
INTRAMUSCULAR | Status: AC
  Filled 2022-07-01: qty 30

## 2022-07-01 MED ORDER — SODIUM CHLORIDE 0.9 % IV SOLN: INTRAVENOUS | Status: DC

## 2022-07-01 MED ORDER — SODIUM CHLORIDE 0.9% FLUSH: 3.0000 mL | INTRAVENOUS | Status: DC | PRN

## 2022-07-01 MED ORDER — ONDANSETRON HCL 4 MG/2ML IJ SOLN: 4.0000 mg | Freq: Four times a day (QID) | INTRAMUSCULAR | Status: DC | PRN

## 2022-07-01 MED ORDER — FENTANYL CITRATE (PF) 100 MCG/2ML IJ SOLN
INTRAMUSCULAR | Status: DC | PRN
  Administered 2022-07-01 (×2): 50 ug via INTRAVENOUS

## 2022-07-01 MED ORDER — ASPIRIN 81 MG PO CHEW: 81.0000 mg | CHEWABLE_TABLET | ORAL | Status: DC

## 2022-07-01 MED ORDER — CLOPIDOGREL BISULFATE 75 MG PO TABS
75.0000 mg | ORAL_TABLET | Freq: Every day | ORAL | 3 refills | Status: DC
  Filled 2022-07-01: qty 90, 90d supply, fill #0

## 2022-07-01 MED ORDER — SODIUM CHLORIDE 0.9 % WEIGHT BASED INFUSION
3.0000 mL/kg/h | INTRAVENOUS | Status: AC
  Administered 2022-07-01: 3 mL/kg/h via INTRAVENOUS

## 2022-07-01 MED ORDER — SODIUM CHLORIDE 0.9 % WEIGHT BASED INFUSION: 1.0000 mL/kg/h | INTRAVENOUS | Status: DC

## 2022-07-01 MED ORDER — LABETALOL HCL 5 MG/ML IV SOLN: 10.0000 mg | INTRAVENOUS | Status: DC | PRN

## 2022-07-01 MED ORDER — MIDAZOLAM HCL 2 MG/2ML IJ SOLN
INTRAMUSCULAR | Status: AC
  Filled 2022-07-01: qty 2

## 2022-07-01 MED ORDER — CLOPIDOGREL BISULFATE 300 MG PO TABS
ORAL_TABLET | ORAL | Status: AC
  Filled 2022-07-01: qty 2

## 2022-07-01 MED ORDER — FENTANYL CITRATE (PF) 100 MCG/2ML IJ SOLN
INTRAMUSCULAR | Status: AC
  Filled 2022-07-01: qty 2

## 2022-07-01 MED ORDER — ACETAMINOPHEN 325 MG PO TABS: 650.0000 mg | ORAL_TABLET | ORAL | Status: DC | PRN

## 2022-07-01 MED ORDER — IOHEXOL 350 MG/ML SOLN
INTRAVENOUS | Status: DC | PRN
  Administered 2022-07-01: 157 mL

## 2022-07-01 MED ORDER — CLOPIDOGREL BISULFATE 300 MG PO TABS
ORAL_TABLET | ORAL | Status: DC | PRN
  Administered 2022-07-01: 600 mg via ORAL

## 2022-07-01 MED ORDER — NITROGLYCERIN 1 MG/10 ML FOR IR/CATH LAB
INTRA_ARTERIAL | Status: AC
  Filled 2022-07-01: qty 10

## 2022-07-01 MED ORDER — MIDAZOLAM HCL 2 MG/2ML IJ SOLN
INTRAMUSCULAR | Status: DC | PRN
  Administered 2022-07-01: 2 mg via INTRAVENOUS

## 2022-07-01 MED ORDER — SODIUM CHLORIDE 0.9% FLUSH: 3.0000 mL | Freq: Two times a day (BID) | INTRAVENOUS | Status: DC

## 2022-07-01 MED ORDER — HEPARIN (PORCINE) IN NACL 1000-0.9 UT/500ML-% IV SOLN
INTRAVENOUS | Status: AC
  Filled 2022-07-01: qty 1000

## 2022-07-01 MED ORDER — HYDRALAZINE HCL 20 MG/ML IJ SOLN: 10.0000 mg | INTRAMUSCULAR | Status: DC | PRN

## 2022-07-01 MED ORDER — LIDOCAINE HCL (PF) 1 % IJ SOLN
INTRAMUSCULAR | Status: DC | PRN
  Administered 2022-07-01: 2 mL via INTRADERMAL

## 2022-07-01 SURGICAL SUPPLY — 22 items
BALL SAPPHIRE NC24 3.25X22 (BALLOONS) ×1
BALL SAPPHIRE NC24 3.75X10 (BALLOONS) ×1
BALLN SAPPHIRE 2.0X15 (BALLOONS) ×1
BALLOON SAPPHIRE 2.0X15 (BALLOONS) IMPLANT
BALLOON SAPPHIRE NC24 3.25X22 (BALLOONS) IMPLANT
BALLOON SAPPHIRE NC24 3.75X10 (BALLOONS) IMPLANT
CATH 5FR JL3.5 JR4 ANG PIG MP (CATHETERS) IMPLANT
CATH VISTA GUIDE 6FR XBLAD3.5 (CATHETERS) IMPLANT
DEVICE RAD COMP TR BAND LRG (VASCULAR PRODUCTS) IMPLANT
GLIDESHEATH SLEND SS 6F .021 (SHEATH) IMPLANT
GUIDEWIRE INQWIRE 1.5J.035X260 (WIRE) IMPLANT
INQWIRE 1.5J .035X260CM (WIRE) ×1
KIT ENCORE 26 ADVANTAGE (KITS) IMPLANT
KIT HEART LEFT (KITS) ×1 IMPLANT
PACK CARDIAC CATHETERIZATION (CUSTOM PROCEDURE TRAY) ×1 IMPLANT
SET ATX SIMPLICITY (MISCELLANEOUS) IMPLANT
STENT SYNERGY XD 3.0X28 (Permanent Stent) IMPLANT
STENT SYNERGY XD 3.50X16 (Permanent Stent) IMPLANT
SYNERGY XD 3.0X28 (Permanent Stent) ×1 IMPLANT
SYNERGY XD 3.50X16 (Permanent Stent) ×1 IMPLANT
TUBING CIL FLEX 10 FLL-RA (TUBING) ×1 IMPLANT
WIRE COUGAR XT STRL 190CM (WIRE) IMPLANT

## 2022-07-01 NOTE — Discharge Summary (Signed)
Discharge Summary for Same Day PCI   Patient ID: Patrick Noble. MRN: [id number]; DOB: Feb 17, 1951  Admit date: 07/01/2022 Discharge date: 07/01/2022  Primary Care Provider: Hulan Fess, MD  Primary Cardiologist: Pixie Casino, MD  Primary Electrophysiologist:  None   Discharge Diagnoses    Active Problems:   Unstable angina [facility])   Diagnostic Studies/Procedures    Cardiac Catheterization 07/01/2022:    Prox RCA lesion is 100% stenosed.   2nd Mrg lesion is 99% stenosed.   Prox LAD lesion is 80% stenosed.   Mid LAD lesion is 50% stenosed.   A drug-eluting stent was successfully placed using a SYNERGY XD 3.0X28.   A drug-eluting stent was successfully placed using a SYNERGY XD 3.50X16.   Post intervention, there is a 0% residual stenosis.   Post intervention, there is a 0% residual stenosis.   Severe proximal LAD stenosis. Moderate mid LAD stenosis Successful PTCA/DES x 1 proximal LAD Severe obtuse marginal branch stenosis Successful PTCA/DES x 1 obtuse marginal branch extending back into the AV groove Circumflex CTO of the proximal RCA. The distal vessel fills from left to right collaterals.    Recommendations: Continue DAPT with ASA and Plavix for one year. Same day post PCI discharge.   Diagnostic Dominance: Right  Intervention   _____________   History of Present Illness     Patrick Noble. is a 71 y.o. male with a hx of former tobacco use quit 37 years ago, hypertension (not on meds), low back pain and history of scoliosis.  Patient was previously on lisinopril, however this medication was later stopped after blood pressure normalized.  He was also on Lovaza in the past however no longer on it.  Patient was referred by his PCP to see Dr. Debara Pickett on 06/09/2022 for evaluation of shortness of breath with exertion and also chest discomfort.  Blood pressure was normal on that day.  Blood work obtained on the same day showed a creatinine 1.14.  Nonfasting glucose was 233.   Vitamin D was low, he was placed on vitamin D supplement.  Coronary CT obtained on 06/28/2022 showed 0 to 24% left main disease, 50 to 69% proximal LAD lesion, 25 to 49% mid LAD lesion, 0 to 24% D1 lesion, 0 to 24% proximal left circumflex lesion, 70 to 99% mid left circumflex lesion.  Subsequent FFR showed hemodynamically significant lesion in the proximal LAD, mid left circumflex artery and CTO of the proximal RCA.  The results are suggestive of triple-vessel disease.  Cardiac catheterization was recommended.   Patient presented to the office on 9/20 for follow-up along with his wife.  His biggest complaint for the past year is in the exertional fatigue. He also felt more short of breath with activity with intermittent chest pain.  His father had MI in his 28s.  His father also has prostate cancer however passed away due to COPD and pneumonia.  Mother had pancreatic cancer.  He has chronic low back pain, however he did notice occasional chest discomfort when he walks his dog.  He has not had any persistent chest pain since the last visit.  We discussed his recent coronary CT and FFR report.  Patient was previously scheduled to go to [place] from Sunday until next Thursday.  He is aware of benefit and risk of the cardiac catheterization and the wish to find out the answer soon.  He is also aware that with triple-vessel disease, there is a chance our interventional cardiologist may  recommend bypass surgery instead of stenting, if that is the case, they will have to reschedule their trip to [place].  He was started on aspirin, Lipitor and low-dose metoprolol.   Cardiac catheterization was arranged for further evaluation.  Hospital Course     The patient underwent cardiac cath as noted above with severe proximal LAD stenosis treated with PCI/DES x1 as well as severe OM stenosis treated with PCI/DES x1, CTO of proximal RCA with left-to-right collaterals. Plan for DAPT with ASA/Plavix for at least 1 year. The  patient was seen by cardiac rehab while in short stay. There were no observed complications post cath. Radial cath site was re-evaluated prior to discharge and found to be stable without any complications. Instructions/precautions regarding cath site care were given prior to discharge.  Archie Balboa. was seen by Dr. Angelena Form and determined stable for discharge home. Follow up with our office has been arranged. Medications are listed below. Pertinent changes include plavix.  _____________  Cath/PCI Registry Performance & Quality Measures: Aspirin prescribed? - Yes ADP Receptor Inhibitor (Plavix/Clopidogrel, Brilinta/Ticagrelor or Effient/Prasugrel) prescribed (includes medically managed patients)? - Yes High Intensity Statin (Lipitor 40-22m or Crestor 20-434m prescribed? - Yes For EF <40%, was ACEI/ARB prescribed? - Not Applicable (EF >/= 4088%For EF <40%, Aldosterone Antagonist (Spironolactone or Eplerenone) prescribed? - Not Applicable (EF >/= 4041%Cardiac Rehab Phase II ordered (Included Medically managed Patients)? - Yes  _____________   Discharge Vitals Blood pressure 122/67, pulse 71, temperature 97.7 F (36.5 C), temperature source Temporal, resp. rate (!) 23, height 6' 4" (1.93 m), weight 93 kg, SpO2 95 %.  Filed Weights   07/01/22 0725  Weight: 93 kg    Last Labs & Radiologic Studies    CBC Recent Labs    06/30/22 1424  WBC 8.6  HGB 13.6  HCT 42.3  MCV 67*  PLT 27660 Basic Metabolic Panel Recent Labs    06/30/22 1424  NA 141  K 5.2  CL 103  CO2 28  GLUCOSE 126*  BUN 21  CREATININE 1.12  CALCIUM 10.5*   Liver Function Tests Recent Labs    06/30/22 1424  AST 24  ALT 29  ALKPHOS 119  BILITOT 0.8  PROT 7.8  ALBUMIN 5.4*   No results for input(s): "LIPASE", "AMYLASE" in the last 72 hours. High Sensitivity Troponin:   No results for input(s): "TROPONINIHS" in the last 720 hours.  BNP Invalid input(s): "POCBNP" D-Dimer No results for input(s):  "DDIMER" in the last 72 hours. Hemoglobin A1C No results for input(s): "HGBA1C" in the last 72 hours. Fasting Lipid Panel No results for input(s): "CHOL", "HDL", "LDLCALC", "TRIG", "CHOLHDL", "LDLDIRECT" in the last 72 hours. Thyroid Function Tests No results for input(s): "TSH", "T4TOTAL", "T3FREE", "THYROIDAB" in the last 72 hours.  Invalid input(s): "FREET3" _____________  CARDIAC CATHETERIZATION  Result Date: 07/01/2022   Prox RCA lesion is 100% stenosed.   2nd Mrg lesion is 99% stenosed.   Prox LAD lesion is 80% stenosed.   Mid LAD lesion is 50% stenosed.   A drug-eluting stent was successfully placed using a SYNERGY XD 3.0X28.   A drug-eluting stent was successfully placed using a SYNERGY XD 3.50X16.   Post intervention, there is a 0% residual stenosis.   Post intervention, there is a 0% residual stenosis. Severe proximal LAD stenosis. Moderate mid LAD stenosis Successful PTCA/DES x 1 proximal LAD Severe obtuse marginal branch stenosis Successful PTCA/DES x 1 obtuse marginal branch extending back into the AV  groove Circumflex CTO of the proximal RCA. The distal vessel fills from left to right collaterals. Recommendations: Continue DAPT with ASA and Plavix for one year. Same day post PCI discharge.   CT CORONARY MORPH W/CTA COR W/SCORE W/CA W/CM &/OR WO/CM  Addendum Date: 06/29/2022   ADDENDUM REPORT: 06/29/2022 08:29 EXAM: OVER-READ INTERPRETATION  CT CHEST The following report is an over-read performed by radiologist Dr. Jason Nest [facility] on 06/29/2022. This over-read does not include interpretation of cardiac or coronary anatomy or pathology. The coronary CTA interpretation by the cardiologist is attached. COMPARISON:  None. FINDINGS: Vascular: No significant extracardiac vascular findings. Mediastinum/Nodes: No lymphadenopathy. Lungs/Pleura: Bibasilar hypoventilatory changes. No suspicious pulmonary nodules. Upper Abdomen: No acute abnormality. Musculoskeletal:  Dextroconvex thoracic curvature. No acute osseous abnormality. No suspicious osseous lesions. IMPRESSION: No significant extracardiac findings in the chest. Electronically Signed   By: Maurine Simmering M.D.   On: 06/29/2022 08:29   Result Date: 06/29/2022 CLINICAL DATA:  35M with chest pain EXAM: Cardiac/Coronary CTA TECHNIQUE: The patient was scanned on a [employer]. FINDINGS: A 100 kV prospective scan was triggered in the descending thoracic aorta at 111 HU's. Axial non-contrast 3 mm slices were carried out through the heart. The data set was analyzed on a dedicated work station and scored using the [place]. Gantry rotation speed was 250 msecs and collimation was .6 mm. No beta blockade and 0.8 mg of sl NTG was given. The 3D data set was reconstructed in 5% intervals of the 67-82 % of the R-R cycle. Diastolic phases were analyzed on a dedicated work station using MPR, MIP and VRT modes. The patient received 80 cc of contrast. Coronary Arteries:  Normal coronary origin.  Right dominance. RCA is a large dominant artery that gives rise to PDA and PLA. CTO of proximal RCA Left main is a large artery that gives rise to LAD and LCX arteries. Calcified plaque in left main causes 0-24% stenosis. LAD is a large vessel. Noncalcified plaque in proximal LAD causes 50-69% stenosis. Mixed plaque in mid LAD causes 25-49% stenosis. Calcified plaque in D1 causes 0-24% stenosis LCX is a non-dominant artery that gives rise to one large OM1 branch. Calcified plaque in proximal LCX causes 0-24% stenosis. Noncalcified plaque in mid LCX causes severe (70-99%) stenosis. Other findings: Left Ventricle: Normal size Left Atrium: Normal size Pulmonary Veins: Normal configuration Right Ventricle: Normal size Right Atrium: Normal size Cardiac valves: No calcifications Thoracic aorta: Normal size Pulmonary Arteries: Normal size Systemic Veins: Normal drainage Pericardium: Normal thickness IMPRESSION: 1. Coronary calcium score of  135. This was 52nd percentile for age and sex matched control. 2.  Normal coronary origin with right dominance. 3.  Obstructive CAD 4.  Proximal RCA CTO 5.  Noncalcified plaque in mid LCX causes severe (70-99%) stenosis 6. Noncalcified plaque in proximal LAD causes moderate (50-69%) stenosis. Will send for CTFFR 7.  Cardiac catheterization recommended CAD-RADS 4 Severe stenosis. (70-99% or > 50% left main). Cardiac catheterization or CT FFR is recommended. Consider symptom-guided anti-ischemic pharmacotherapy as well as risk factor modification per guideline directed care. Electronically Signed: By: Oswaldo Milian M.D. On: 06/28/2022 17:12   CT CORONARY FRACTIONAL FLOW RESERVE DATA PREP  Result Date: 06/28/2022 EXAM: FFRCT ANALYSIS FINDINGS: FFRct analysis was performed on the original cardiac CT angiogram dataset. Diagrammatic representation of the FFRct analysis is provided in a separate PDF document in PACS. This dictation was created using the PDF document and an interactive 3D model of the results.  3D model is not available in the EMR/PACS. Normal FFR range is >0.80. 1. Left Main: No significant stenosis 2. LAD: CTFFR 0.73 across lesion in proximal LAD, suggesting lesion is hemodynamically significant 3. LCX: CTFR 0.54 across lesion in mid LCX, suggesting lesion is hemodynamically significant 4. RCA: CTO proximal RCA IMPRESSION: 1. CTFFR suggests severe triple vessel CAD. Cardiac catheterization recommended Electronically Signed   By: Oswaldo Milian M.D.   On: 06/28/2022 20:54   CT CORONARY FRACTIONAL FLOW RESERVE FLUID ANALYSIS  Result Date: 06/28/2022 EXAM: FFRCT ANALYSIS FINDINGS: FFRct analysis was performed on the original cardiac CT angiogram dataset. Diagrammatic representation of the FFRct analysis is provided in a separate PDF document in PACS. This dictation was created using the PDF document and an interactive 3D model of the results. 3D model is not available in the EMR/PACS.  Normal FFR range is >0.80. 1. Left Main: No significant stenosis 2. LAD: CTFFR 0.73 across lesion in proximal LAD, suggesting lesion is hemodynamically significant 3. LCX: CTFR 0.54 across lesion in mid LCX, suggesting lesion is hemodynamically significant 4. RCA: CTO proximal RCA IMPRESSION: 1. CTFFR suggests severe triple vessel CAD. Cardiac catheterization recommended Electronically Signed   By: Oswaldo Milian M.D.   On: 06/28/2022 20:54   VAS US CAROTID  Result Date: 06/14/2022 Carotid Arterial Duplex Study Patient Name:  Patrick Noble.  Date of Exam:   06/10/2022 Medical Rec #: [id number]      Accession #:    [id number] Date of Birth: Aug 04, 1951     Patient Gender: M Patient Age:   26 years Exam Location:  [facility] Procedure:      VAS US CAROTID Referring Phys: Chrissie Noa HILTY --------------------------------------------------------------------------------  Indications:  Patient reports intermittent dizziness when he gets up from               sitting sometimes. Denies other cerebrovascular symptoms at this               time. Risk Factors: Past history of smoking. Performing Technologist: Mariane Masters RVT  Examination Guidelines: A complete evaluation includes B-mode imaging, spectral Doppler, color Doppler, and power Doppler as needed of all accessible portions of each vessel. Bilateral testing is considered an integral part of a complete examination. Limited examinations for reoccurring indications may be performed as noted.  Right Carotid Findings: +----------+--------+--------+--------+------------------+--------+           PSV cm/sEDV cm/sStenosisPlaque DescriptionComments +----------+--------+--------+--------+------------------+--------+ CCA Prox  128     13                                         +----------+--------+--------+--------+------------------+--------+ CCA Distal84      14                                          +----------+--------+--------+--------+------------------+--------+ ICA Prox  145     31      1-39%   smooth                     +----------+--------+--------+--------+------------------+--------+ ICA Mid   102     25                                         +----------+--------+--------+--------+------------------+--------+  ICA Distal92      30                                         +----------+--------+--------+--------+------------------+--------+ ECA       196     20                                         +----------+--------+--------+--------+------------------+--------+ +----------+--------+-------+----------------+-------------------+           PSV cm/sEDV cmsDescribe        Arm Pressure (mmHG) +----------+--------+-------+----------------+-------------------+ Subclavian152     1      Multiphasic, [id number]                 +----------+--------+-------+----------------+-------------------+ +---------+--------+--+--------+-+---------+ VertebralPSV cm/s43EDV cm/s9Antegrade +---------+--------+--+--------+-+---------+  Left Carotid Findings: +----------+--------+--------+--------+------------------+--------+           PSV cm/sEDV cm/sStenosisPlaque DescriptionComments +----------+--------+--------+--------+------------------+--------+ CCA Prox  107     14                                         +----------+--------+--------+--------+------------------+--------+ CCA Distal101     17                                         +----------+--------+--------+--------+------------------+--------+ ICA Prox  104     19      Normal                             +----------+--------+--------+--------+------------------+--------+ ICA Mid   78      28                                         +----------+--------+--------+--------+------------------+--------+ ICA Distal89      28                                          +----------+--------+--------+--------+------------------+--------+ ECA       141     15                                         +----------+--------+--------+--------+------------------+--------+ +----------+--------+--------+----------------+-------------------+           PSV cm/sEDV cm/sDescribe        Arm Pressure (mmHG) +----------+--------+--------+----------------+-------------------+ [id number]      1       Multiphasic, [id number]                 +----------+--------+--------+----------------+-------------------+ +---------+--------+--+--------+--+---------+ VertebralPSV cm/s37EDV cm/s10Antegrade +---------+--------+--+--------+--+---------+   Summary: Right Carotid: Velocities in the right ICA are consistent with a 1-39% stenosis. Left Carotid: There is no evidence of stenosis in the left ICA. The extracranial               vessels were near-normal with only minimal wall thickening or  plaque. Vertebrals:  Bilateral vertebral arteries demonstrate antegrade flow. Subclavians: Normal flow hemodynamics were seen in bilateral subclavian              arteries. *See table(s) above for measurements and observations.  Electronically signed by Carlyle Dolly MD on 06/14/2022 at 5:59:53 PM.    Final     Disposition   Pt is being discharged home today in good condition.  Follow-up Plans & Appointments     Follow-up Information     Pixie Casino, MD Follow up on 07/19/2022.   Specialty: Cardiology Why: at 3:45pm for your follow up appt Contact information: [place] [address] [phone]                Discharge Instructions     Amb Referral to Cardiac Rehabilitation   Complete by: As directed    Diagnosis: Coronary Stents   After initial evaluation and assessments completed: Virtual Based Care may be provided alone or in conjunction with Phase 2 Cardiac Rehab based on patient barriers.: Yes   Intensive Cardiac  Rehabilitation (ICR) [place] location only OR Traditional Cardiac Rehabilitation (TCR) *If criteria for ICR are not met will enroll in TCR [facility] only): Yes        Discharge Medications   Allergies as of 07/01/2022       Reactions   Prozac [fluoxetine Hcl]    Causes anxiety        Medication List     STOP taking these medications    ibuprofen 200 MG tablet Commonly known as: ADVIL       TAKE these medications    aspirin EC 81 MG tablet Take 1 tablet (81 mg total) by mouth daily. Swallow whole.   atorvastatin 80 MG tablet Commonly known as: LIPITOR Take 1 tablet (80 mg total) by mouth daily.   BENEFIBER DRINK MIX PO Take 1 Scoop by mouth daily.   clonazePAM 0.5 MG tablet Commonly known as: KLONOPIN Take 0.25 mg by mouth at bedtime.   clopidogrel 75 MG tablet Commonly known as: Plavix Take 1 tablet (75 mg total) by mouth daily.   Fish Oil 1000 MG Caps Take 1,000 mg by mouth daily.   fluticasone 50 MCG/ACT nasal spray Commonly known as: FLONASE Place 1 spray into both nostrils daily.   HYDROcodone-acetaminophen 5-325 MG tablet Commonly known as: NORCO/VICODIN Take 0.5-1 tablets by mouth every 6 (six) hours as needed for moderate pain.   hydrocortisone 2.5 % rectal cream Commonly known as: ANUSOL-HC Apply topically 2 (two) times daily.   loratadine 10 MG tablet Commonly known as: CLARITIN Take 10 mg by mouth daily as needed for allergies.   melatonin 5 MG Tabs Take 5 mg by mouth at bedtime.   metoprolol tartrate 25 MG tablet Commonly known as: LOPRESSOR Take 0.5 tablets (12.5 mg total) by mouth 2 (two) times daily.   nitroGLYCERIN 0.4 MG SL tablet Commonly known as: NITROSTAT Place 1 tablet (0.4 mg total) under the tongue every 5 (five) minutes as needed for chest pain.   PARoxetine 40 MG tablet Commonly known as: PAXIL Take 40 mg by mouth at bedtime.   PRESERVISION AREDS 2 PO Take 1 capsule by mouth 2 (two) times daily.   multivitamin with  minerals tablet Take 1 tablet by mouth daily.   PROBIOTIC DAILY PO Take 1 capsule by mouth daily.   Vitamin D (Ergocalciferol) 1.25 MG (50000 UNIT) Caps capsule Commonly known as: DRISDOL Take 1 capsule (50,000 Units  total) by mouth every 7 (seven) days. Take for 4 weeks then start OTC vitamin D 1000 IU daily What changed: when to take this           Allergies Allergies  Allergen Reactions   Prozac [Fluoxetine Hcl]     Causes anxiety    Outstanding Labs/Studies   N/a   Duration of Discharge Encounter   Greater than 30 minutes including physician time.  Signed, Reino Bellis, NP 07/01/2022, 2:05 PM

## 2022-07-01 NOTE — Progress Notes (Signed)
CARDIAC REHAB PHASE I    Post stent education including risk factors, restrictions, heart healthy diet, site care, antiplatelet therapy importance, exercise guidelines, and CRP2 reviewed with pt and wife. All questions and concerns addressed. Plan for home today.   1130-1200  Vanessa Barbara, RN BSN 07/01/2022 11:55 AM

## 2022-07-01 NOTE — Addendum Note (Signed)
Addended by: Jacqulynn Cadet on: 07/01/2022 11:20 AM   Modules accepted: Orders

## 2022-07-01 NOTE — Telephone Encounter (Signed)
Discussed with the wife regarding the patient's cath report. He received 2 stents during cath today and will be discharged the same day. We recommend an echocardiogram as outpatient, wife is aware we will contact him to arrange complete echocardiogram for CAD.

## 2022-07-01 NOTE — Telephone Encounter (Signed)
Placed order for echocardiogram

## 2022-07-01 NOTE — Interval H&P Note (Signed)
History and Physical Interval Note:  07/01/2022 8:47 AM  Patrick Noble.  has presented today for surgery, with the diagnosis of abnormal coronary ct.  The various methods of treatment have been discussed with the patient and family. After consideration of risks, benefits and other options for treatment, the patient has consented to  Procedure(s): LEFT HEART CATH AND CORONARY ANGIOGRAPHY (N/A) as a surgical intervention.  The patient's history has been reviewed, patient examined, no change in status, stable for surgery.  I have reviewed the patient's chart and labs.  Questions were answered to the patient's satisfaction.    Cath Lab Visit (complete for each Cath Lab visit)  Clinical Evaluation Leading to the Procedure:   ACS: No.  Non-ACS:    Anginal Classification: CCS III  Anti-ischemic medical therapy: Minimal Therapy (1 class of medications)  Non-Invasive Test Results: High-risk stress test findings: cardiac mortality >3%/year (severe disease on coronary CTA)  Prior CABG: No previous CABG        Patrick Noble

## 2022-07-02 ENCOUNTER — Encounter (HOSPITAL_COMMUNITY): Payer: Self-pay | Admitting: Cardiovascular Disease

## 2022-07-02 MED FILL — Nitroglycerin IV Soln 100 MCG/ML in D5W: INTRA_ARTERIAL | Qty: 10 | Status: AC

## 2022-07-05 IMAGING — MR MR LUMBAR SPINE W/O CM
4 of 7 series · 21 of 48 positions shown · non-contrast
Comparison: Scoliosis series 03/02/2021. Outside previous lumbar
MRI 06/27/2021.

CLINICAL DATA: 70-year-old male with low back pain. Lumbar
radiculopathy.

EXAM:
MRI LUMBAR SPINE WITHOUT CONTRAST
TECHNIQUE: Multiplanar, multisequence MR imaging of the lumbar spine was
performed. No intravenous contrast was administered.

[Series 5: T2 · sagittal · 4.0mm · 0.76mm/px · 6 of 19 slices shown (1 of 3)]
[im 1/19]
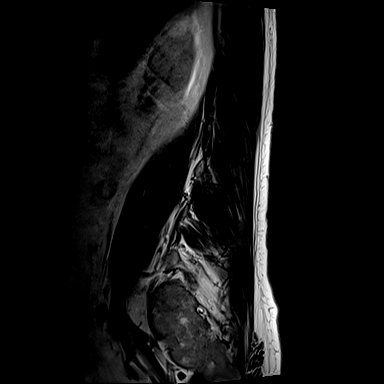
[im 4/19]
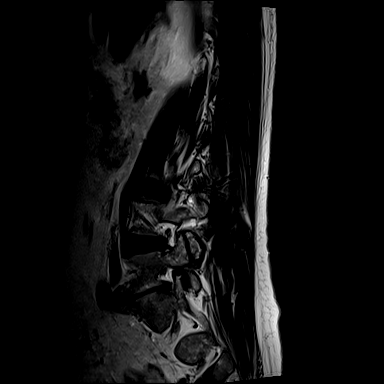
[im 8/19]
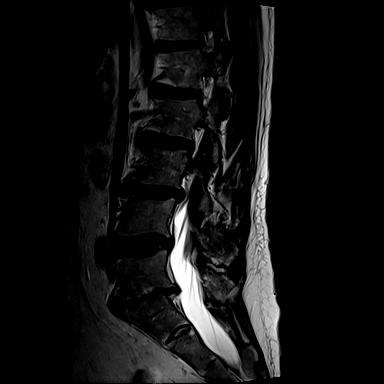
[im 11/19]
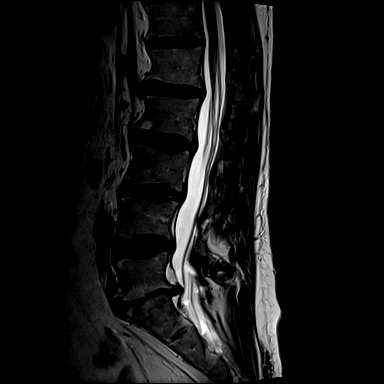
[im 15/19]
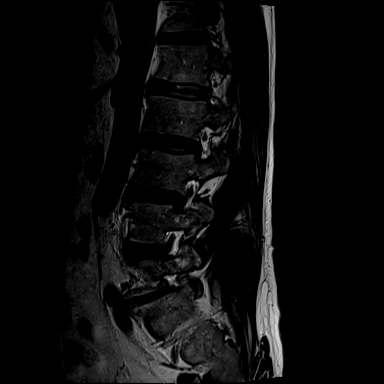
[im 19/19]
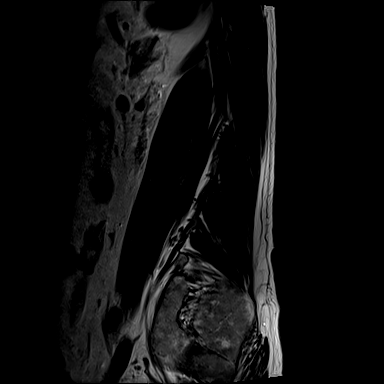

[Series 7: T1 · sagittal · 4.0mm · 0.76mm/px · 3 of 19 slices shown]
[im 4/19]
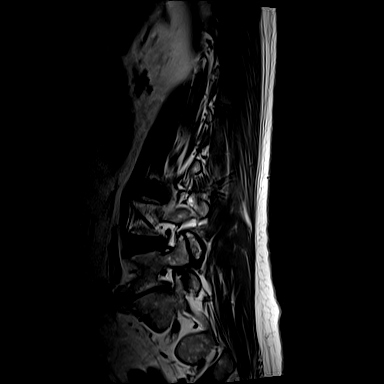
[im 11/19]
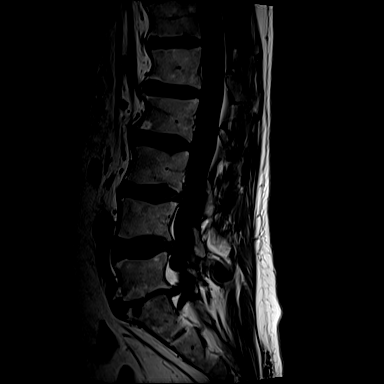
[im 19/19]
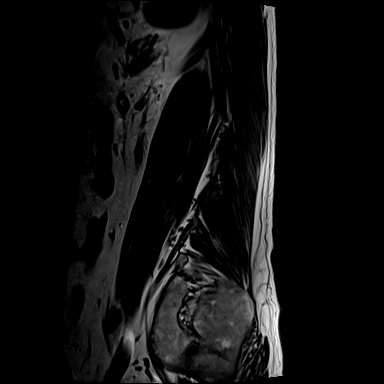

[Series 13: T2 · axial · 4.0mm · 0.28mm/px · z∈[-132,+91]mm · 9 of 45 slices shown (2 of 3)]
[im 1/45]
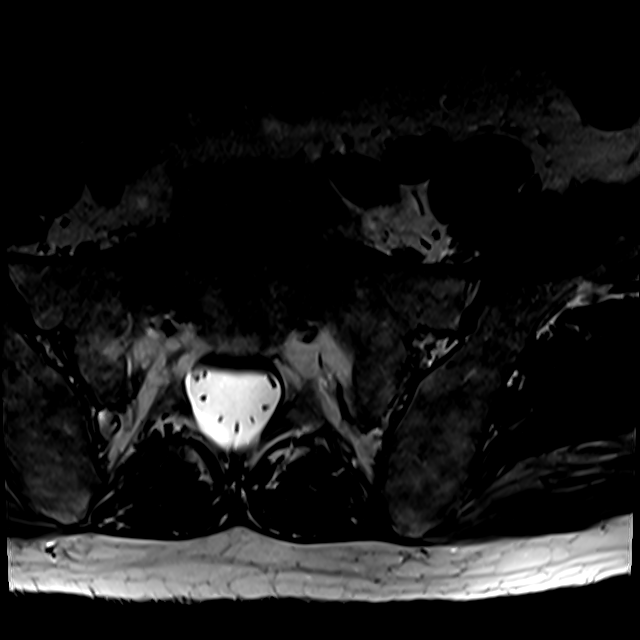
[im 8/45]
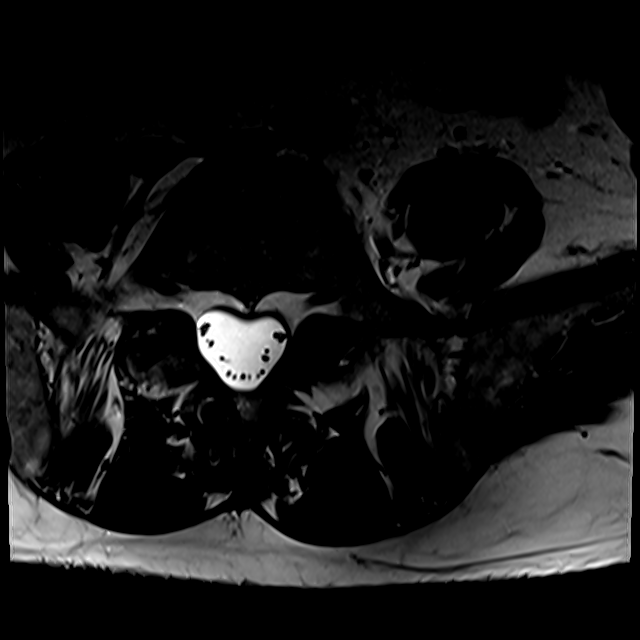
[im 15/45]
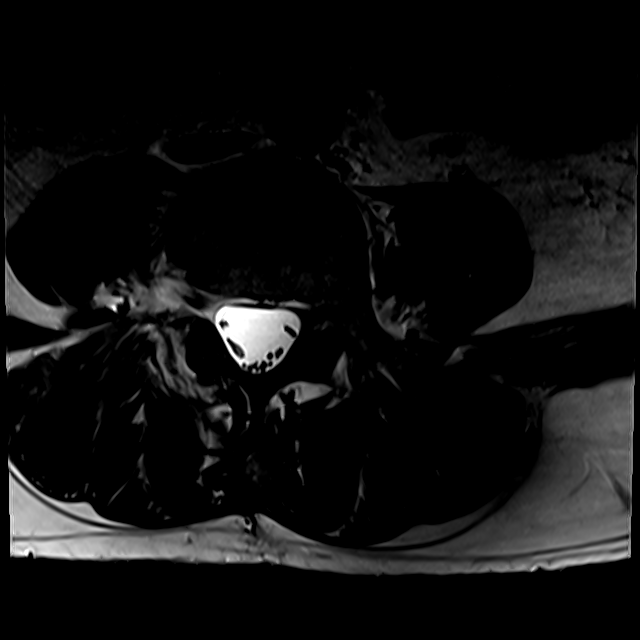
[im 19/45]
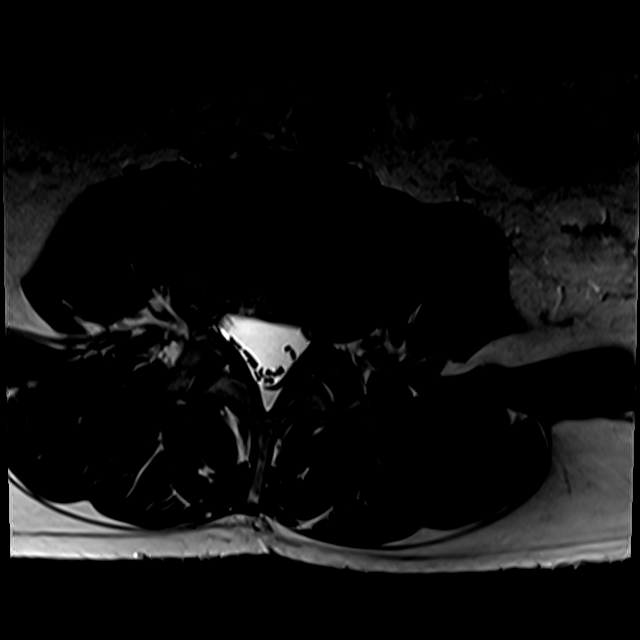
[im 23/45]
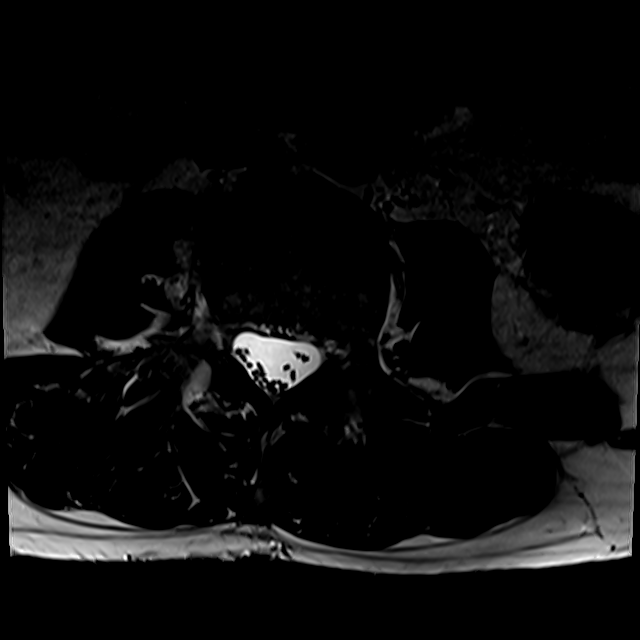
[im 26/45]
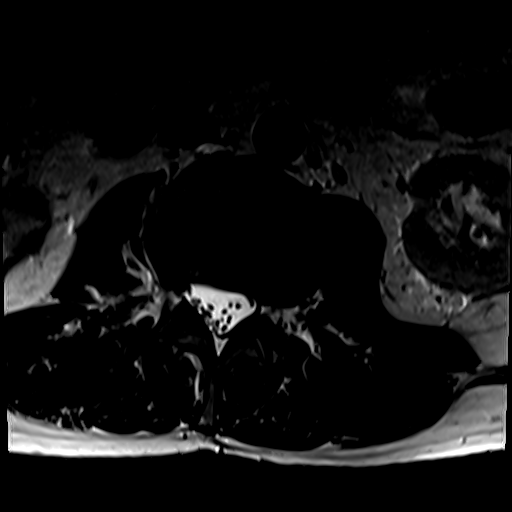
[im 30/45]
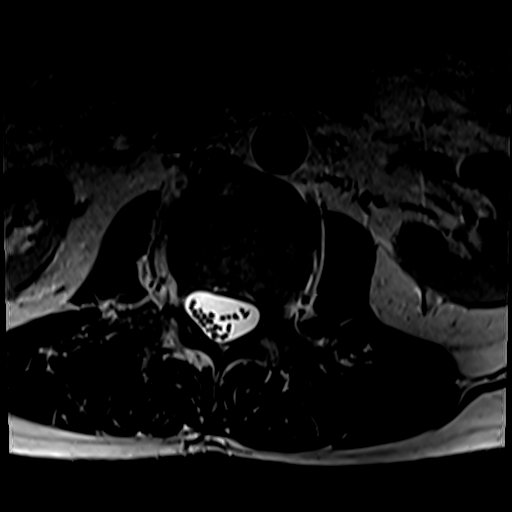
[im 37/45]
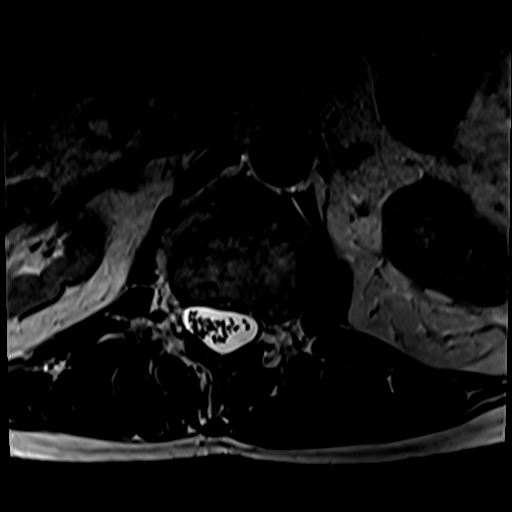
[im 45/45]
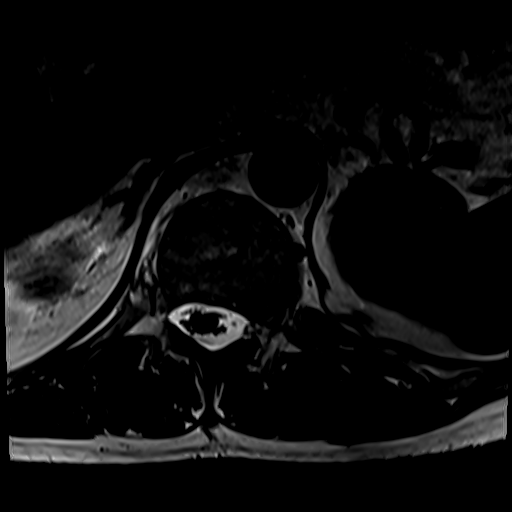

[Series 14: T2 · coronal · 5.0mm · 0.73mm/px · 3 of 15 slices shown (3 of 3)]
[im 1/15]
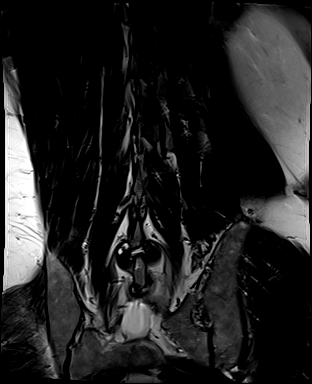
[im 10/15]
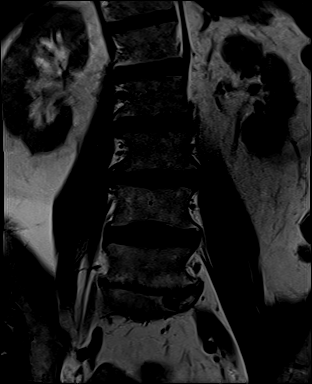
[im 15/15]
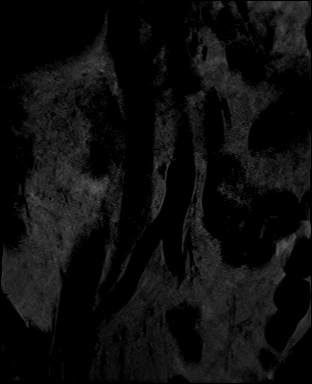

[21 of 48 positions shown; findings below may reference images not displayed]

FINDINGS: Segmentation: Lumbar segmentation appears to be normal on the
scoliosis series last year, and on these images.

Alignment: Mild to moderate levoconvex lumbar scoliosis with more
advanced dextroconvex thoracic scoliosis demonstrated on prior
radiographs. Associated mild straightening of lumbar lordosis. Mild
retrolisthesis of L5 on S1 is chronic.

Vertebrae: No marrow edema or evidence of acute osseous abnormality.
Chronic degenerative endplate marrow signal changes most pronounced
at L5-S1. Normal background bone marrow signal. Intact visible
sacrum and SI joints.

Conus medullaris and cauda equina: Conus extends to the L1 level. No
lower spinal cord or conus signal abnormality. Generally capacious
spinal canal. Cauda equina nerve roots appear normal.

Paraspinal and other soft tissues: Negative.

Disc levels:

T12-L1:  Negative.

L1-L2:  Mild mostly far lateral disc bulging.  No stenosis.

L2-L3: Mild disc space loss. Circumferential disc bulging greater on
the left. No significant stenosis.

L3-L4: Mild circumferential disc bulge. Mild endplate spurring and
facet hypertrophy. Borderline to mild bilateral L3 neural foraminal
stenosis appears stable since last year.

L4-L5: Mild circumferential disc bulge. Mild to moderate facet and
ligament flavum hypertrophy. Degenerative increased STIR signal in
the interspinous ligament (series 8, image 9) and posteriorly
situated synovial cysts bilaterally (series 8, image 7 on the
right). Tiny midline synovial cyst posteriorly (series 13, image 35
and series 8, image 10). But capacious spinal canal. No spinal or
convincing lateral recess stenosis. Mild to moderate left L4
foraminal stenosis appears stable.

L5-S1: Pronounced chronic disc space loss. Circumferential disc
osteophyte complex eccentric to the left. Mild facet hypertrophy.
Capacious spinal canal with no spinal or lateral recess stenosis.
Mild to moderate left and mild right L5 foraminal stenosis appears
stable.
IMPRESSION: 1. No acute osseous abnormality. Levoconvex lumbar scoliosis with
more advanced dextroconvex thoracic scoliosis demonstrated on prior
radiographs.

2. Advanced chronic disc and endplate degeneration at L5-S1. And up
to moderate posterior element degeneration at L4-L5 including
degeneration of the interspinous ligament and multiple small
synovial cysts.

3. But Capacious spinal canal with no spinal or convincing lateral
recess stenosis. Stable chronic lumbar neural foraminal stenosis
from last year, up to moderate at the left L4 and L5 nerve levels
and mild elsewhere.

## 2022-07-07 ENCOUNTER — Telehealth (HOSPITAL_COMMUNITY): Payer: Self-pay

## 2022-07-07 NOTE — Telephone Encounter (Signed)
Called patient to see if he is interested in the Cardiac Rehab Program. Patient expressed interest. Explained scheduling process, patient verbalized understanding. Will contact patient for scheduling once f/u has been completed.  

## 2022-07-19 ENCOUNTER — Ambulatory Visit: Payer: PPO | Attending: Internal Medicine | Admitting: Internal Medicine

## 2022-07-19 ENCOUNTER — Encounter: Payer: Self-pay | Admitting: Internal Medicine

## 2022-07-19 VITALS — BP 122/64 | HR 66 | Ht 76.0 in | Wt 210.0 lb

## 2022-07-19 DIAGNOSIS — E785 Hyperlipidemia, unspecified: Secondary | ICD-10-CM | POA: Diagnosis not present

## 2022-07-19 DIAGNOSIS — Z955 Presence of coronary angioplasty implant and graft: Secondary | ICD-10-CM | POA: Diagnosis not present

## 2022-07-19 DIAGNOSIS — I6521 Occlusion and stenosis of right carotid artery: Secondary | ICD-10-CM

## 2022-07-19 DIAGNOSIS — I2510 Atherosclerotic heart disease of native coronary artery without angina pectoris: Secondary | ICD-10-CM | POA: Diagnosis not present

## 2022-07-19 DIAGNOSIS — E559 Vitamin D deficiency, unspecified: Secondary | ICD-10-CM

## 2022-07-19 DIAGNOSIS — Z79899 Other long term (current) drug therapy: Secondary | ICD-10-CM

## 2022-07-19 NOTE — Patient Instructions (Addendum)
Medication Instructions:  Your Physician recommend you continue on your current medication as directed.    *If you need a refill on your cardiac medications before your next appointment, please call your pharmacy*   Lab Work: Your physician recommends that you return for lab work in April, 2024 (Lipid, NMR, LPa Vitamin D)    Testing/Procedures: None ordered today   Follow-Up: At [facility], you and your health needs are our priority.  As part of our continuing mission to provide you with exceptional heart care, we have created designated Provider Care Teams.  These Care Teams include your primary Cardiologist (physician) and Advanced Practice Providers (APPs -  Physician Assistants and Nurse Practitioners) who all work together to provide you with the care you need, when you need it.  We recommend signing up for the patient portal called "MyChart".  Sign up information is provided on this After Visit Summary.  MyChart is used to connect with patients for Virtual Visits (Telemedicine).  Patients are able to view lab/test results, encounter notes, upcoming appointments, etc.  Non-urgent messages can be sent to your provider as well.   To learn more about what you can do with MyChart, go to [email or web address].    Your next appointment:   6 month(s)  The format for your next appointment:   In Person  Provider:   Pixie Casino, MD

## 2022-07-19 NOTE — Progress Notes (Unsigned)
OFFICE CONSULT NOTE  Chief Complaint:  Shortness of breath, chest tightness  Primary Care Physician: Hulan Fess, MD  HPI:  Patrick Noble. is a 71 y.o. male who is being seen today for the evaluation of shortness of breath and chest tightness at the request of Little, Lennette Bihari, MD. this is a pleasant 71 year old male whose wife is a patient of mine.  He has recently had worsening shortness of breath and chest tightness.  He reports longstanding history of significant scoliosis, diagnosed at a young age.  He also has a history of hypertension, kidney stones and chronic pain.  He is concerned about his tightness and shortness of breath as it may represent heart disease.  He reports his father had an MI at age 71.  He also was noted to have bilateral carotid artery disease but was a smoker.  Mr. Vosler has a remote history of smoking but quit more than 30 years ago.  He previously was on lisinopril for hypertension but says he stopped it and his blood pressures have been normal.  He also used to take Lovaza but is no longer on that.  His most recent recent lipid profile was in October 2022 showing total cholesterol 178, HDL 32, triglycerides 172 and LDL 115.  Renal function was normal.  A1c 5.8%.  EKG today shows normal sinus rhythm at 86 and possible left atrial enlargement with nonspecific ST changes.  PMHx:  Past Medical History:  Diagnosis Date   Degenerative arthritis of lumbar spine    Hypertension    Kidney stone    Prostate pain    Scoliosis     Past Surgical History:  Procedure Laterality Date   CHOLECYSTECTOMY     CORONARY STENT INTERVENTION N/A 07/01/2022   Procedure: CORONARY STENT INTERVENTION;  Surgeon: Burnell Blanks, MD;  Location: [facility];  Service: Cardiovascular;  Laterality: N/A;   injections in the back     KNEE ARTHROSCOPY     LEFT HEART CATH AND CORONARY ANGIOGRAPHY N/A 07/01/2022   Procedure: LEFT HEART CATH AND CORONARY ANGIOGRAPHY;  Surgeon:  Burnell Blanks, MD;  Location: [facility];  Service: Cardiovascular;  Laterality: N/A;    FAMHx:  Family History  Problem Relation Age of Onset   Cancer Mother    Cancer Father    Heart attack Father    Neuropathy Father     SOCHx:   reports that he has quit smoking. He has never used smokeless tobacco. He reports current alcohol use. He reports that he does not use drugs.  ALLERGIES:  Allergies  Allergen Reactions   Prozac [Fluoxetine Hcl]     Causes anxiety    ROS: Pertinent items noted in HPI and remainder of comprehensive ROS otherwise negative.  HOME MEDS: Current Outpatient Medications on File Prior to Visit  Medication Sig Dispense Refill   aspirin EC 81 MG tablet Take 1 tablet (81 mg total) by mouth daily. Swallow whole. 90 tablet 3   atorvastatin (LIPITOR) 80 MG tablet Take 1 tablet (80 mg total) by mouth daily. 90 tablet 3   clonazePAM (KLONOPIN) 0.5 MG tablet Take 0.25 mg by mouth at bedtime.     clopidogrel (PLAVIX) 75 MG tablet Take 1 tablet (75 mg total) by mouth daily. 90 tablet 3   fluticasone (FLONASE) 50 MCG/ACT nasal spray Place 1 spray into both nostrils daily.     HYDROcodone-acetaminophen (NORCO/VICODIN) 5-325 MG tablet Take 0.5-1 tablets by mouth every 6 (  six) hours as needed for moderate pain.      hydrocortisone (ANUSOL-HC) 2.5 % rectal cream Apply topically 2 (two) times daily.     loratadine (CLARITIN) 10 MG tablet Take 10 mg by mouth daily as needed for allergies.      melatonin 5 MG TABS Take 5 mg by mouth at bedtime.     metoprolol tartrate (LOPRESSOR) 25 MG tablet Take 0.5 tablets (12.5 mg total) by mouth 2 (two) times daily. 90 tablet 3   Multiple Vitamins-Minerals (MULTIVITAMIN WITH MINERALS) tablet Take 1 tablet by mouth daily.     Multiple Vitamins-Minerals (PRESERVISION AREDS 2 PO) Take 1 capsule by mouth 2 (two) times daily.     nitroGLYCERIN (NITROSTAT) 0.4 MG SL tablet Place 1 tablet (0.4 mg total) under the tongue every  5 (five) minutes as needed for chest pain. 30 tablet 2   Omega-3 Fatty Acids (FISH OIL) 1000 MG CAPS Take 1,000 mg by mouth daily.     PARoxetine (PAXIL) 40 MG tablet Take 40 mg by mouth at bedtime.      Probiotic Product (PROBIOTIC DAILY PO) Take 1 capsule by mouth daily.     Vitamin D, Ergocalciferol, (DRISDOL) 1.25 MG (50000 UNIT) CAPS capsule Take 1 capsule (50,000 Units total) by mouth every 7 (seven) days. Take for 4 weeks then start OTC vitamin D 1000 IU daily (Patient taking differently: Take 50,000 Units by mouth every Monday. Take for 4 weeks then start OTC vitamin D 1000 IU daily) 4 capsule 0   Wheat Dextrin (BENEFIBER DRINK MIX PO) Take 1 Scoop by mouth daily.     No current facility-administered medications on file prior to visit.    LABS/IMAGING: No results found for this or any previous visit (from the past 48 hour(s)). No results found.  LIPID PANEL: No results found for: "CHOL", "TRIG", "HDL", "CHOLHDL", "VLDL", "LDLCALC", "LDLDIRECT"  WEIGHTS: Wt Readings from Last 3 Encounters:  07/19/22 210 lb (95.3 kg)  07/01/22 205 lb (93 kg)  06/30/22 209 lb 12.8 oz (95.2 kg)    VITALS: BP 122/64   Pulse 66   Ht '6\' 4"'$  (1.93 m)   Wt 210 lb (95.3 kg)   SpO2 96%   BMI 25.56 kg/m   EXAM: General appearance: alert and no distress Neck: no carotid bruit, no JVD, thyroid not enlarged, symmetric, no tenderness/mass/nodules, and right earlobe crease Lungs: clear to auscultation bilaterally Heart: regular rate and rhythm, S1, S2 normal, no murmur, click, rub or gallop Abdomen: soft, non-tender; bowel sounds normal; no masses,  no organomegaly Extremities: extremities normal, atraumatic, no cyanosis or edema and marked thoracic scoliosis Pulses: 2+ and symmetric Skin: Skin color, texture, turgor normal. No rashes or lesions Neurologic: Grossly normal Psych: Pleasant  EKG: Normal sinus rhythm at 86, possible left atrial argument with nonspecific ST changes- personally  reviewed  ASSESSMENT: Progressive dyspnea on exertion and chest tightness History of premature coronary artery disease in his father History of hypertension-resolved Remote smoking history Vitamin D deficiency  PLAN: 1.   Mr. Tallman has had progressive dyspnea on exertion and chest tightness.  He does have significant thoracic scoliosis but his symptoms have worsened recently.  He does have a family history of early onset heart disease in his father and was a former smoker.  He was previously on medication for blood pressure but he says its been more normal recently.  I would like to do a coronary evaluation.  I recommend a coronary CT angiogram.  He will likely  need 50 mg metoprolol prior to the procedure to help with the heart rate.  I will contact him with those results and will make medication recommendations accordingly.  He says he has follow-up with his PCP for an annual visit in a few months.  He will likely get labs at that time.  He noted he had a history of vitamin D deficiency.  We will go ahead and check that now as well per his request.  He previously was on high-dose vitamin D but did not remain on supplementation.  Thanks again for the kind referral.  Follow-up with me afterwards.  Pixie Casino, MD, [facility] [place] Director of the [facility]  [facility] Diplomate of the American Board of Clinical Lipidology Attending Cardiologist  Direct Dial: [phone]  Fax: [phone]  Website:  [email or web address] 07/19/2022, 4:17 PM

## 2022-07-30 ENCOUNTER — Ambulatory Visit (HOSPITAL_COMMUNITY): Payer: PPO | Attending: Cardiology

## 2022-07-30 DIAGNOSIS — I2510 Atherosclerotic heart disease of native coronary artery without angina pectoris: Secondary | ICD-10-CM | POA: Insufficient documentation

## 2022-07-30 LAB — ECHOCARDIOGRAM COMPLETE
Area-P 1/2: 3.21 cm2
S' Lateral: 3.3 cm

## 2022-08-04 ENCOUNTER — Encounter: Payer: Self-pay | Admitting: Internal Medicine

## 2022-08-04 NOTE — Progress Notes (Signed)
Normal pumping function og heart. No significant valve issue. Mildly dilated aortic root.

## 2022-08-24 NOTE — Telephone Encounter (Signed)
I would stop the metoprolol since he cannot lower the dose further and see how he feels.  Dr Lemmie Evens

## 2022-09-08 ENCOUNTER — Encounter: Payer: Self-pay | Admitting: Internal Medicine

## 2022-09-16 ENCOUNTER — Telehealth (HOSPITAL_COMMUNITY): Payer: Self-pay

## 2022-09-16 NOTE — Telephone Encounter (Signed)
Called patient to see if he was interested in participating in the Cardiac Rehab Program. Patient stated yes. Patient will come in for orientation on 09/21/22 @ 10:30AM and will attend the 10:15AM exercise class. Went over insurance, patient verbalized understanding.    Tourist information centre manager.

## 2022-09-16 NOTE — Telephone Encounter (Signed)
Pt insurance is active and benefits verified through [employer]. Co-pay $15.00, DED $0.00/$0.00 met, out of pocket $3,200.00/$1,296.98 met, co-insurance 0%. No pre-authorization required. Rasmeet/[employer], 09/16/22 @ 2:21PM, [id number]   How many CR sessions are covered? (36 sessions for TCR, 72 sessions for ICR)72 Is this a lifetime maximum or an annual maximum? Lifetime Has the member used any of these services to date? No Is there a time limit (weeks/months) on start of program and/or program completion? No

## 2022-09-17 ENCOUNTER — Telehealth (HOSPITAL_COMMUNITY): Payer: Self-pay

## 2022-09-17 ENCOUNTER — Encounter: Payer: Self-pay | Admitting: Internal Medicine

## 2022-09-21 ENCOUNTER — Encounter (HOSPITAL_COMMUNITY)
Admission: RE | Admit: 2022-09-21 | Discharge: 2022-09-21 | Disposition: A | Discharge status: Home / Self Care | Payer: PPO | Source: Ambulatory Visit | Attending: Internal Medicine | Admitting: Internal Medicine

## 2022-09-21 ENCOUNTER — Encounter (HOSPITAL_COMMUNITY): Payer: Self-pay

## 2022-09-21 VITALS — BP 98/64 | HR 81 | Ht 76.5 in | Wt 209.0 lb

## 2022-09-21 DIAGNOSIS — Z955 Presence of coronary angioplasty implant and graft: Secondary | ICD-10-CM | POA: Diagnosis present

## 2022-09-21 DIAGNOSIS — Z48812 Encounter for surgical aftercare following surgery on the circulatory system: Secondary | ICD-10-CM | POA: Insufficient documentation

## 2022-09-21 HISTORY — DX: Atherosclerotic heart disease of native coronary artery without angina pectoris: I25.10

## 2022-09-21 HISTORY — DX: Hyperlipidemia, unspecified: E78.5

## 2022-09-21 NOTE — Progress Notes (Addendum)
Cardiac Rehab Medication Review by a Nurse  Does the patient  feel that his/her medications are working for him/her?  yes  Has the patient been experiencing any side effects to the medications prescribed?  yes  Does the patient measure his/her own blood pressure or blood glucose at home?  yes   Does the patient have any problems obtaining medications due to transportation or finances?   no  Understanding of regimen: excellent Understanding of indications: excellent Potential of compliance: excellent    Nurse comments: Patrick Noble' says he experienced some lightheadedness from the betablocker which he restarted today per MD. Orthostatic BP's checked today. Will monitor BP's during exercise. Sitting BP 98/64, Standing BP 132/70 no symptoms from sitting to standing position.    Christa See Chloe Baig RN 09/21/2022 2:06 PM

## 2022-09-21 NOTE — Progress Notes (Signed)
Cardiac Individual Treatment Plan  Patient Details  Name: Patrick Noble. MRN: [id number] Date of Birth: [birth year] Referring Provider:   Flowsheet Row INTENSIVE CARDIAC REHAB ORIENT from 09/21/2022 in [facility] for Heart, Vascular, & [place]  Referring Provider Pixie Casino, MD       Initial Encounter Date:  [place] from 09/21/2022 in [facility] for Heart, Vascular, & [facility]  Date 09/21/22       Visit Diagnosis: 07/01/22 DES pLAD/OM2  Patient's Home Medications on Admission:  Current Outpatient Medications:    acetaminophen (TYLENOL) 500 MG tablet, Take 1,000 mg by mouth every 6 (six) hours as needed for moderate pain., Disp: , Rfl:    aspirin EC 81 MG tablet, Take 1 tablet (81 mg total) by mouth daily. Swallow whole., Disp: 90 tablet, Rfl: 3   atorvastatin (LIPITOR) 80 MG tablet, Take 1 tablet (80 mg total) by mouth daily., Disp: 90 tablet, Rfl: 3   Cholecalciferol (DIALYVITE VITAMIN D 5000) 125 MCG (5000 UT) capsule, Take 5,000 Units by mouth daily., Disp: , Rfl:    clonazePAM (KLONOPIN) 0.5 MG tablet, Take 0.25 mg by mouth at bedtime., Disp: , Rfl:    clopidogrel (PLAVIX) 75 MG tablet, Take 1 tablet (75 mg total) by mouth daily., Disp: 90 tablet, Rfl: 3   fluticasone (FLONASE) 50 MCG/ACT nasal spray, Place 1 spray into both nostrils daily as needed for allergies., Disp: , Rfl:    HYDROcodone-acetaminophen (NORCO/VICODIN) 5-325 MG tablet, Take 0.5-1 tablets by mouth every 6 (six) hours as needed for moderate pain. , Disp: , Rfl:    melatonin 5 MG TABS, Take 5 mg by mouth at bedtime., Disp: , Rfl:    metoprolol tartrate (LOPRESSOR) 25 MG tablet, Take 0.5 tablets (12.5 mg total) by mouth 2 (two) times daily., Disp: 90 tablet, Rfl: 3   Multiple Vitamins-Minerals (MULTIVITAMIN WITH MINERALS) tablet, Take 1 tablet by mouth daily., Disp: , Rfl:    Multiple Vitamins-Minerals (PRESERVISION  AREDS 2 PO), Take 1 capsule by mouth 2 (two) times daily., Disp: , Rfl:    nitroGLYCERIN (NITROSTAT) 0.4 MG SL tablet, Place 1 tablet (0.4 mg total) under the tongue every 5 (five) minutes as needed for chest pain., Disp: 30 tablet, Rfl: 2   PARoxetine (PAXIL) 40 MG tablet, Take 40 mg by mouth at bedtime. , Disp: , Rfl:    Wheat Dextrin (BENEFIBER DRINK MIX PO), Take 1 Scoop by mouth daily., Disp: , Rfl:    Vitamin D, Ergocalciferol, (DRISDOL) 1.25 MG (50000 UNIT) CAPS capsule, Take 1 capsule (50,000 Units total) by mouth every 7 (seven) days. Take for 4 weeks then start OTC vitamin D 1000 IU daily (Patient not taking: Reported on 09/20/2022), Disp: 4 capsule, Rfl: 0  Past Medical History: Past Medical History:  Diagnosis Date   Coronary artery disease    Degenerative arthritis of lumbar spine    Hyperlipidemia    Hypertension    Kidney stone    Prostate pain    Scoliosis     Tobacco Use: Social History   Tobacco Use  Smoking Status Former  Smokeless Tobacco Never    Labs: Review Flowsheet        No data to display          Capillary Blood Glucose: No results found for: "GLUCAP"   Exercise Target Goals: Exercise Program Goal: Individual exercise prescription set using results from initial 6 min walk test  and THRR while considering  patient's activity barriers and safety.   Exercise Prescription Goal: Initial exercise prescription builds to 30-45 minutes a day of aerobic activity, 2-3 days per week.  Home exercise guidelines will be given to patient during program as part of exercise prescription that the participant will acknowledge.  Activity Barriers & Risk Stratification:  Activity Barriers & Cardiac Risk Stratification - 09/21/22 1100       Activity Barriers & Cardiac Risk Stratification   Activity Barriers Other (comment);Back Problems    Comments Chronic bilateral knee pain, scoliosis of the thoracic region, chronic low back pain, L5-S1 degenerative disc  disease.    Cardiac Risk Stratification High             6 Minute Walk:  6 Minute Walk     Row Name 09/21/22 1110         6 Minute Walk   Phase Initial     Distance 1526 feet     Walk Time 6 minutes     # of Rest Breaks 0     MPH 2.89     METS 3.9     RPE 11     Perceived Dyspnea  1     VO2 Peak 13.66     Symptoms Yes (comment)     Comments Mild shortness of breath, RPD=1; mild back pain, chronic= 1/10 on the pain scale. Symptoms resolved with rest.     Resting HR 81 bpm     Resting BP 98/64     Resting Oxygen Saturation  98 %     Exercise Oxygen Saturation  during 6 min walk 97 %     Max Ex. HR 103 bpm     Max Ex. BP 158/70     2 Minute Post BP 112/68              Oxygen Initial Assessment:   Oxygen Re-Evaluation:   Oxygen Discharge (Final Oxygen Re-Evaluation):   Initial Exercise Prescription:  Initial Exercise Prescription - 09/21/22 1300       Date of Initial Exercise RX and Referring Provider   Date 09/21/22    Referring Provider Pixie Casino, MD    Expected Discharge Date 11/19/21      Recumbant Bike   Level --    Watts --    Minutes --    METs --      NuStep   Level 2    SPM 85    Minutes 15    METs 2.5      Recumbant Elliptical   Level 1    Watts 25    Minutes 15    METs 2.8      Prescription Details   Frequency (times per week) 3    Duration Progress to 30 minutes of continuous aerobic without signs/symptoms of physical distress      Intensity   THRR 40-80% of Max Heartrate 60-121    Ratings of Perceived Exertion 11-13    Perceived Dyspnea 0-4      Progression   Progression Continue to progress workloads to maintain intensity without signs/symptoms of physical distress.      Resistance Training   Training Prescription Yes    Weight 4 lbs    Reps 10-15             Perform Capillary Blood Glucose checks as needed.  Exercise Prescription Changes:   Exercise Comments:   Exercise Goals and Review:    Exercise Goals  [facility] Name 09/21/22 1100             Exercise Goals   Increase Physical Activity Yes       Intervention Provide advice, education, support and counseling about physical activity/exercise needs.;Develop an individualized exercise prescription for aerobic and resistive training based on initial evaluation findings, risk stratification, comorbidities and participant's personal goals.       Expected Outcomes Short Term: Attend rehab on a regular basis to increase amount of physical activity.;Long Term: Exercising regularly at least 3-5 days a week.       Increase Strength and Stamina Yes       Intervention Provide advice, education, support and counseling about physical activity/exercise needs.;Develop an individualized exercise prescription for aerobic and resistive training based on initial evaluation findings, risk stratification, comorbidities and participant's personal goals.       Expected Outcomes Short Term: Increase workloads from initial exercise prescription for resistance, speed, and METs.;Short Term: Perform resistance training exercises routinely during rehab and add in resistance training at home;Long Term: Improve cardiorespiratory fitness, muscular endurance and strength as measured by increased METs and functional capacity (6MWT)       Able to understand and use rate of perceived exertion (RPE) scale Yes       Intervention Provide education and explanation on how to use RPE scale       Expected Outcomes Short Term: Able to use RPE daily in rehab to express subjective intensity level;Long Term:  Able to use RPE to guide intensity level when exercising independently       Knowledge and understanding of Target Heart Rate Range (THRR) Yes       Intervention Provide education and explanation of THRR including how the numbers were predicted and where they are located for reference       Expected Outcomes Short Term: Able to state/look up THRR;Long Term: Able to use THRR  to govern intensity when exercising independently;Short Term: Able to use daily as guideline for intensity in rehab       Able to check pulse independently Yes       Intervention Provide education and demonstration on how to check pulse in carotid and radial arteries.;Review the importance of being able to check your own pulse for safety during independent exercise       Expected Outcomes Short Term: Able to explain why pulse checking is important during independent exercise;Long Term: Able to check pulse independently and accurately       Understanding of Exercise Prescription Yes       Intervention Provide education, explanation, and written materials on patient's individual exercise prescription       Expected Outcomes Short Term: Able to explain program exercise prescription;Long Term: Able to explain home exercise prescription to exercise independently                Exercise Goals Re-Evaluation :   Discharge Exercise Prescription (Final Exercise Prescription Changes):   Nutrition:  Target Goals: Understanding of nutrition guidelines, daily intake of sodium '1500mg'$ , cholesterol '200mg'$ , calories 30% from fat and 7% or less from saturated fats, daily to have 5 or more servings of fruits and vegetables.  Biometrics:  Pre Biometrics - 09/21/22 0953       Pre Biometrics   Waist Circumference 41.75 inches    Hip Circumference 42 inches    Waist to Hip Ratio 0.99 %    Triceps Skinfold 11 mm    % Body Fat 25.5 %  Grip Strength 52 kg    Flexibility --   Not performed, chronic back pain.   Single Leg Stand 14.37 seconds              Nutrition Therapy Plan and Nutrition Goals:   Nutrition Assessments:  MEDIFICTS Score Key: ?70 Need to make dietary changes  40-70 Heart Healthy Diet ? 40 Therapeutic Level Cholesterol Diet    Picture Your Plate Scores: <77 Unhealthy dietary pattern with much room for improvement. 41-50 Dietary pattern unlikely to meet recommendations  for good health and room for improvement. 51-60 More healthful dietary pattern, with some room for improvement.  >60 Healthy dietary pattern, although there may be some specific behaviors that could be improved.    Nutrition Goals Re-Evaluation:   Nutrition Goals Re-Evaluation:   Nutrition Goals Discharge (Final Nutrition Goals Re-Evaluation):   Psychosocial: Target Goals: Acknowledge presence or absence of significant depression and/or stress, maximize coping skills, provide positive support system. Participant is able to verbalize types and ability to use techniques and skills needed for reducing stress and depression.  Initial Review & Psychosocial Screening:  Initial Psych Review & Screening - 09/21/22 1359       Initial Review   Current issues with Current Anxiety/Panic;History of Depression      Family Dynamics   Good Support System? Yes   Patrick Noble has his wife and daughter for support   Comments Patrick Noble is currently taking an antidepressant. Patrick Noble denies being depressed at this time.      Barriers   Psychosocial barriers to participate in program There are no identifiable barriers or psychosocial needs.      Screening Interventions   Interventions Encouraged to exercise             Quality of Life Scores:  Quality of Life - 09/21/22 1321       Quality of Life   Select Quality of Life      Quality of Life Scores   Health/Function Pre 25.47 %    Socioeconomic Pre 25.79 %    Psych/Spiritual Pre 24.71 %    Family Pre 27.6 %    GLOBAL Pre 25.69 %            Scores of 19 and below usually indicate a poorer quality of life in these areas.  A difference of  2-3 points is a clinically meaningful difference.  A difference of 2-3 points in the total score of the Quality of Life Index has been associated with significant improvement in overall quality of life, self-image, physical symptoms, and general health in studies assessing change in quality of  life.  PHQ-9: Review Flowsheet       09/21/2022  Depression screen PHQ 2/9  Decreased Interest 0  Down, Depressed, Hopeless 0  PHQ - 2 Score 0   Interpretation of Total Score  Total Score Depression Severity:  1-4 = Minimal depression, 5-9 = Mild depression, 10-14 = Moderate depression, 15-19 = Moderately severe depression, 20-27 = Severe depression   Psychosocial Evaluation and Intervention:   Psychosocial Re-Evaluation:   Psychosocial Discharge (Final Psychosocial Re-Evaluation):   Vocational Rehabilitation: Provide vocational rehab assistance to qualifying candidates.   Vocational Rehab Evaluation & Intervention:  Vocational Rehab - 09/21/22 1402       Initial Vocational Rehab Evaluation & Intervention   Assessment shows need for Vocational Rehabilitation No   Patrick Noble is retired and does not need vocational rehab at this time  Education: Education Goals: Education classes will be provided on a weekly basis, covering required topics. Participant will state understanding/return demonstration of topics presented.     Core Videos: Exercise    Move It!  Clinical staff conducted group or individual video education with verbal and written material and guidebook.  Patient learns the recommended Pritikin exercise program. Exercise with the goal of living a long, healthy life. Some of the health benefits of exercise include controlled diabetes, healthier blood pressure levels, improved cholesterol levels, improved heart and lung capacity, improved sleep, and better body composition. Everyone should speak with their doctor before starting or changing an exercise routine.  Biomechanical Limitations Clinical staff conducted group or individual video education with verbal and written material and guidebook.  Patient learns how biomechanical limitations can impact exercise and how we can mitigate and possibly overcome limitations to have an impactful and balanced  exercise routine.  Body Composition Clinical staff conducted group or individual video education with verbal and written material and guidebook.  Patient learns that body composition (ratio of muscle mass to fat mass) is a key component to assessing overall fitness, rather than body weight alone. Increased fat mass, especially visceral belly fat, can put [place] at increased risk for metabolic syndrome, type 2 diabetes, heart disease, and even death. It is recommended to combine diet and exercise (cardiovascular and resistance training) to improve your body composition. Seek guidance from your physician and exercise physiologist before implementing an exercise routine.  Exercise Action Plan Clinical staff conducted group or individual video education with verbal and written material and guidebook.  Patient learns the recommended strategies to achieve and enjoy long-term exercise adherence, including variety, self-motivation, self-efficacy, and positive decision making. Benefits of exercise include fitness, good health, weight management, more energy, better sleep, less stress, and overall well-being.  Medical   Heart Disease Risk Reduction Clinical staff conducted group or individual video education with verbal and written material and guidebook.  Patient learns our heart is our most vital organ as it circulates oxygen, nutrients, white blood cells, and hormones throughout the entire body, and carries waste away. Data supports a plant-based eating plan like the Pritikin Program for its effectiveness in slowing progression of and reversing heart disease. The video provides a number of recommendations to address heart disease.   Metabolic Syndrome and Belly Fat  Clinical staff conducted group or individual video education with verbal and written material and guidebook.  Patient learns what metabolic syndrome is, how it leads to heart disease, and how one can reverse it and keep it from coming back. You  have metabolic syndrome if you have 3 of the following 5 criteria: abdominal obesity, high blood pressure, high triglycerides, low HDL cholesterol, and high blood sugar.  Hypertension and Heart Disease Clinical staff conducted group or individual video education with verbal and written material and guidebook.  Patient learns that high blood pressure, or hypertension, is very common in the [place]. Hypertension is largely due to excessive salt intake, but other important risk factors include being overweight, physical inactivity, drinking too much alcohol, smoking, and not eating enough potassium from fruits and vegetables. High blood pressure is a leading risk factor for heart attack, stroke, congestive heart failure, dementia, kidney failure, and premature death. Long-term effects of excessive salt intake include stiffening of the arteries and thickening of heart muscle and organ damage. Recommendations include ways to reduce hypertension and the risk of heart disease.  Diseases of Our Time - Focusing on Diabetes Clinical  staff conducted group or individual video education with verbal and written material and guidebook.  Patient learns why the best way to stop diseases of our time is prevention, through food and other lifestyle changes. Medicine (such as prescription pills and surgeries) is often only a Band-Aid on the problem, not a long-term solution. Most common diseases of our time include obesity, type 2 diabetes, hypertension, heart disease, and cancer. The Pritikin Program is recommended and has been proven to help reduce, reverse, and/or prevent the damaging effects of metabolic syndrome.  Nutrition   Overview of the Pritikin Eating Plan  Clinical staff conducted group or individual video education with verbal and written material and guidebook.  Patient learns about the [place] for disease risk reduction. The [place] emphasizes a wide variety of unrefined,  minimally-processed carbohydrates, like fruits, vegetables, whole grains, and legumes. Go, Caution, and Stop food choices are explained. Plant-based and lean animal proteins are emphasized. Rationale provided for low sodium intake for blood pressure control, low added sugars for blood sugar stabilization, and low added fats and oils for coronary artery disease risk reduction and weight management.  Calorie Density  Clinical staff conducted group or individual video education with verbal and written material and guidebook.  Patient learns about calorie density and how it impacts the Pritikin Eating Plan. Knowing the characteristics of the food you choose will help you decide whether those foods will lead to weight gain or weight loss, and whether you want to consume more or less of them. Weight loss is usually a side effect of the Pritikin Eating Plan because of its focus on low calorie-dense foods.  Label Reading  Clinical staff conducted group or individual video education with verbal and written material and guidebook.  Patient learns about the Pritikin recommended label reading guidelines and corresponding recommendations regarding calorie density, added sugars, sodium content, and whole grains.  Dining Out - Part 1  Clinical staff conducted group or individual video education with verbal and written material and guidebook.  Patient learns that restaurant meals can be sabotaging because they can be so high in calories, fat, sodium, and/or sugar. Patient learns recommended strategies on how to positively address this and avoid unhealthy pitfalls.  Facts on Fats  Clinical staff conducted group or individual video education with verbal and written material and guidebook.  Patient learns that lifestyle modifications can be just as effective, if not more so, as many medications for lowering your risk of heart disease. A Pritikin lifestyle can help to reduce your risk of inflammation and atherosclerosis  (cholesterol build-up, or plaque, in the artery walls). Lifestyle interventions such as dietary choices and physical activity address the cause of atherosclerosis. A review of the types of fats and their impact on blood cholesterol levels, along with dietary recommendations to reduce fat intake is also included.  Nutrition Action Plan  Clinical staff conducted group or individual video education with verbal and written material and guidebook.  Patient learns how to incorporate Pritikin recommendations into their lifestyle. Recommendations include planning and keeping personal health goals in mind as an important part of their success.  Healthy Mind-Set    Healthy Minds, Bodies, Hearts  Clinical staff conducted group or individual video education with verbal and written material and guidebook.  Patient learns how to identify when they are stressed. Video will discuss the impact of that stress, as well as the many benefits of stress management. Patient will also be introduced to stress management techniques. The way  we think, act, and feel has an impact on our hearts.  How Our Thoughts Can Heal Our Hearts  Clinical staff conducted group or individual video education with verbal and written material and guidebook.  Patient learns that negative thoughts can cause depression and anxiety. This can result in negative lifestyle behavior and serious health problems. Cognitive behavioral therapy is an effective method to help control our thoughts in order to change and improve our emotional outlook.  Additional Videos:  Exercise    Improving Performance  Clinical staff conducted group or individual video education with verbal and written material and guidebook.  Patient learns to use a non-linear approach by alternating intensity levels and lengths of time spent exercising to help burn more calories and lose more body fat. Cardiovascular exercise helps improve heart health, metabolism, hormonal balance,  blood sugar control, and recovery from fatigue. Resistance training improves strength, endurance, balance, coordination, reaction time, metabolism, and muscle mass. Flexibility exercise improves circulation, posture, and balance. Seek guidance from your physician and exercise physiologist before implementing an exercise routine and learn your capabilities and proper form for all exercise.  Introduction to Yoga  Clinical staff conducted group or individual video education with verbal and written material and guidebook.  Patient learns about yoga, a discipline of the coming together of mind, breath, and body. The benefits of yoga include improved flexibility, improved range of motion, better posture and core strength, increased lung function, weight loss, and positive self-image. Yoga's heart health benefits include lowered blood pressure, healthier heart rate, decreased cholesterol and triglyceride levels, improved immune function, and reduced stress. Seek guidance from your physician and exercise physiologist before implementing an exercise routine and learn your capabilities and proper form for all exercise.  Medical   Aging: Enhancing Your Quality of Life  Clinical staff conducted group or individual video education with verbal and written material and guidebook.  Patient learns key strategies and recommendations to stay in good physical health and enhance quality of life, such as prevention strategies, having an advocate, securing a [employer], and keeping a list of medications and system for tracking them. It also discusses how to avoid risk for bone loss.  Biology of Weight Control  Clinical staff conducted group or individual video education with verbal and written material and guidebook.  Patient learns that weight gain occurs because we consume more calories than we burn (eating more, moving less). Even if your body weight is normal, you may have higher ratios of fat  compared to muscle mass. Too much body fat puts you at increased risk for cardiovascular disease, heart attack, stroke, type 2 diabetes, and obesity-related cancers. In addition to exercise, following the [place] can help reduce your risk.  Decoding Lab Results  Clinical staff conducted group or individual video education with verbal and written material and guidebook.  Patient learns that lab test reflects one measurement whose values change over time and are influenced by many factors, including medication, stress, sleep, exercise, food, hydration, pre-existing medical conditions, and more. It is recommended to use the knowledge from this video to become more involved with your lab results and evaluate your numbers to speak with your doctor.   Diseases of Our Time - Overview  Clinical staff conducted group or individual video education with verbal and written material and guidebook.  Patient learns that according to the CDC, 50% to 70% of chronic diseases (such as obesity, type 2 diabetes, elevated lipids, hypertension, and heart  disease) are avoidable through lifestyle improvements including healthier food choices, listening to satiety cues, and increased physical activity.  Sleep Disorders Clinical staff conducted group or individual video education with verbal and written material and guidebook.  Patient learns how good quality and duration of sleep are important to overall health and well-being. Patient also learns about sleep disorders and how they impact health along with recommendations to address them, including discussing with a physician.  Nutrition  Dining Out - Part 2 Clinical staff conducted group or individual video education with verbal and written material and guidebook.  Patient learns how to plan ahead and communicate in order to maximize their dining experience in a healthy and nutritious manner. Included are recommended food choices based on the type of restaurant  the patient is visiting.   Fueling a Best boy conducted group or individual video education with verbal and written material and guidebook.  There is a strong connection between our food choices and our health. Diseases like obesity and type 2 diabetes are very prevalent and are in large-part due to lifestyle choices. The Pritikin Eating Plan provides plenty of food and hunger-curbing satisfaction. It is easy to follow, affordable, and helps reduce health risks.  Menu Workshop  Clinical staff conducted group or individual video education with verbal and written material and guidebook.  Patient learns that restaurant meals can sabotage health goals because they are often packed with calories, fat, sodium, and sugar. Recommendations include strategies to plan ahead and to communicate with the manager, chef, or server to help order a healthier meal.  Planning Your Eating Strategy  Clinical staff conducted group or individual video education with verbal and written material and guidebook.  Patient learns about the [place] and its benefit of reducing the risk of disease. The [place] does not focus on calories. Instead, it emphasizes high-quality, nutrient-rich foods. By knowing the characteristics of the foods, we choose, we can determine their calorie density and make informed decisions.  Targeting Your Nutrition Priorities  Clinical staff conducted group or individual video education with verbal and written material and guidebook.  Patient learns that lifestyle habits have a tremendous impact on disease risk and progression. This video provides eating and physical activity recommendations based on your personal health goals, such as reducing LDL cholesterol, losing weight, preventing or controlling type 2 diabetes, and reducing high blood pressure.  Vitamins and Minerals  Clinical staff conducted group or individual video education with verbal and written  material and guidebook.  Patient learns different ways to obtain key vitamins and minerals, including through a recommended healthy diet. It is important to discuss all supplements you take with your doctor.   Healthy Mind-Set    Smoking Cessation  Clinical staff conducted group or individual video education with verbal and written material and guidebook.  Patient learns that cigarette smoking and tobacco addiction pose a serious health risk which affects millions of people. Stopping smoking will significantly reduce the risk of heart disease, lung disease, and many forms of cancer. Recommended strategies for quitting are covered, including working with your doctor to develop a successful plan.  Culinary   Becoming a Financial trader conducted group or individual video education with verbal and written material and guidebook.  Patient learns that cooking at home can be healthy, cost-effective, quick, and puts them in control. Keys to cooking healthy recipes will include looking at your recipe, assessing your equipment needs, planning ahead, making it simple,  choosing cost-effective seasonal ingredients, and limiting the use of added fats, salts, and sugars.  Cooking - Breakfast and Snacks  Clinical staff conducted group or individual video education with verbal and written material and guidebook.  Patient learns how important breakfast is to satiety and nutrition through the entire day. Recommendations include key foods to eat during breakfast to help stabilize blood sugar levels and to prevent overeating at meals later in the day. Planning ahead is also a key component.  Cooking - Human resources officer conducted group or individual video education with verbal and written material and guidebook.  Patient learns eating strategies to improve overall health, including an approach to cook more at home. Recommendations include thinking of animal protein as a side on your plate  rather than center stage and focusing instead on lower calorie dense options like vegetables, fruits, whole grains, and plant-based proteins, such as beans. Making sauces in large quantities to freeze for later and leaving the skin on your vegetables are also recommended to maximize your experience.  Cooking - Healthy Salads and Dressing Clinical staff conducted group or individual video education with verbal and written material and guidebook.  Patient learns that vegetables, fruits, whole grains, and legumes are the foundations of the [place]. Recommendations include how to incorporate each of these in flavorful and healthy salads, and how to create homemade salad dressings. Proper handling of ingredients is also covered. Cooking - Soups and [employer] - Soups and Desserts Clinical staff conducted group or individual video education with verbal and written material and guidebook.  Patient learns that Pritikin soups and desserts make for easy, nutritious, and delicious snacks and meal components that are low in sodium, fat, sugar, and calorie density, while high in vitamins, minerals, and filling fiber. Recommendations include simple and healthy ideas for soups and desserts.   Overview     The Pritikin Solution Program Overview Clinical staff conducted group or individual video education with verbal and written material and guidebook.  Patient learns that the results of the [place] Program have been documented in more than 100 articles published in peer-reviewed journals, and the benefits include reducing risk factors for (and, in some cases, even reversing) high cholesterol, high blood pressure, type 2 diabetes, obesity, and more! An overview of the three key pillars of the Pritikin Program will be covered: eating well, doing regular exercise, and having a healthy mind-set.  WORKSHOPS  Exercise: Exercise Basics: Building Your Action Plan Clinical staff led group instruction  and group discussion with PowerPoint presentation and patient guidebook. To enhance the learning environment the use of posters, models and videos may be added. At the conclusion of this workshop, patients will comprehend the difference between physical activity and exercise, as well as the benefits of incorporating both, into their routine. Patients will understand the FITT (Frequency, Intensity, Time, and Type) principle and how to use it to build an exercise action plan. In addition, safety concerns and other considerations for exercise and cardiac rehab will be addressed by the presenter. The purpose of this lesson is to promote a comprehensive and effective weekly exercise routine in order to improve patients' overall level of fitness.   Managing Heart Disease: Your Path to a Healthier Heart Clinical staff led group instruction and group discussion with PowerPoint presentation and patient guidebook. To enhance the learning environment the use of posters, models and videos may be added.At the conclusion of this workshop, patients will understand the anatomy and physiology  of the heart. Additionally, they will understand how Pritikin's three pillars impact the risk factors, the progression, and the management of heart disease.  The purpose of this lesson is to provide a high-level overview of the heart, heart disease, and how the Pritikin lifestyle positively impacts risk factors.  Exercise Biomechanics Clinical staff led group instruction and group discussion with PowerPoint presentation and patient guidebook. To enhance the learning environment the use of posters, models and videos may be added. Patients will learn how the structural parts of their bodies function and how these functions impact their daily activities, movement, and exercise. Patients will learn how to promote a neutral spine, learn how to manage pain, and identify ways to improve their physical movement in order to  promote healthy living. The purpose of this lesson is to expose patients to common physical limitations that impact physical activity. Participants will learn practical ways to adapt and manage aches and pains, and to minimize their effect on regular exercise. Patients will learn how to maintain good posture while sitting, walking, and lifting.  Balance Training and Fall Prevention  Clinical staff led group instruction and group discussion with PowerPoint presentation and patient guidebook. To enhance the learning environment the use of posters, models and videos may be added. At the conclusion of this workshop, patients will understand the importance of their sensorimotor skills (vision, proprioception, and the vestibular system) in maintaining their ability to balance as they age. Patients will apply a variety of balancing exercises that are appropriate for their current level of function. Patients will understand the common causes for poor balance, possible solutions to these problems, and ways to modify their physical environment in order to minimize their fall risk. The purpose of this lesson is to teach patients about the importance of maintaining balance as they age and ways to minimize their risk of falling.  WORKSHOPS   Nutrition:  Fueling a Scientist, research (physical sciences) led group instruction and group discussion with PowerPoint presentation and patient guidebook. To enhance the learning environment the use of posters, models and videos may be added. Patients will review the foundational principles of the [place] and understand what constitutes a serving size in each of the food groups. Patients will also learn Pritikin-friendly foods that are better choices when away from home and review make-ahead meal and snack options. Calorie density will be reviewed and applied to three nutrition priorities: weight maintenance, weight loss, and weight gain. The purpose of this lesson is to  reinforce (in a group setting) the key concepts around what patients are recommended to eat and how to apply these guidelines when away from home by planning and selecting Pritikin-friendly options. Patients will understand how calorie density may be adjusted for different weight management goals.  Mindful Eating  Clinical staff led group instruction and group discussion with PowerPoint presentation and patient guidebook. To enhance the learning environment the use of posters, models and videos may be added. Patients will briefly review the concepts of the [place] and the importance of low-calorie dense foods. The concept of mindful eating will be introduced as well as the importance of paying attention to internal hunger signals. Triggers for non-hunger eating and techniques for dealing with triggers will be explored. The purpose of this lesson is to provide patients with the opportunity to review the basic principles of the [place], discuss the value of eating mindfully and how to measure internal cues of hunger and fullness using the Hunger Scale.  Patients will also discuss reasons for non-hunger eating and learn strategies to use for controlling emotional eating.  Targeting Your Nutrition Priorities Clinical staff led group instruction and group discussion with PowerPoint presentation and patient guidebook. To enhance the learning environment the use of posters, models and videos may be added. Patients will learn how to determine their genetic susceptibility to disease by reviewing their family history. Patients will gain insight into the importance of diet as part of an overall healthy lifestyle in mitigating the impact of genetics and other environmental insults. The purpose of this lesson is to provide patients with the opportunity to assess their personal nutrition priorities by looking at their family history, their own health history and current risk factors. Patients will  also be able to discuss ways of prioritizing and modifying the Max for their highest risk areas  Menu  Clinical staff led group instruction and group discussion with PowerPoint presentation and patient guidebook. To enhance the learning environment the use of posters, models and videos may be added. Using menus brought in from [employer], or printed from [employer], patients will apply the [place] dining out guidelines that were presented in the [employer] video. Patients will also be able to practice these guidelines in a variety of provided scenarios. The purpose of this lesson is to provide patients with the opportunity to practice hands-on learning of the [place] with actual menus and practice scenarios.  Label Reading Clinical staff led group instruction and group discussion with PowerPoint presentation and patient guidebook. To enhance the learning environment the use of posters, models and videos may be added. Patients will review and discuss the Pritikin label reading guidelines presented in Pritikin's Label Reading Educational series video. Using fool labels brought in from local grocery stores and markets, patients will apply the label reading guidelines and determine if the packaged food meet the Pritikin guidelines. The purpose of this lesson is to provide patients with the opportunity to review, discuss, and practice hands-on learning of the Pritikin Label Reading guidelines with actual packaged food labels. [place] Workshops are designed to teach patients ways to prepare quick, simple, and affordable recipes at home. The importance of nutrition's role in chronic disease risk reduction is reflected in its emphasis in the overall Pritikin program. By learning how to prepare essential core Pritikin Eating Plan recipes, patients will increase control over what they eat; be able to customize the  flavor of foods without the use of added salt, sugar, or fat; and improve the quality of the food they consume. By learning a set of core recipes which are easily assembled, quickly prepared, and affordable, patients are more likely to prepare more healthy foods at home. These workshops focus on convenient breakfasts, simple entres, side dishes, and desserts which can be prepared with minimal effort and are consistent with nutrition recommendations for cardiovascular risk reduction. Cooking [employer] are taught by a Engineer, materials (RD) who has been trained by the [employer]. The chef or RD has a clear understanding of the importance of minimizing - if not completely eliminating - added fat, sugar, and sodium in recipes. Throughout the series of [place] Workshop sessions, patients will learn about healthy ingredients and efficient methods of cooking to build confidence in their capability to prepare    Cooking School weekly topics:  Adding Flavor- Sodium-Free  Fast and Healthy Breakfasts  Powerhouse Plant-Based Proteins  Satisfying  Salads and Dressings  Simple Sides and Sauces  International Cuisine-Spotlight on the [place] Zones  Delicious Desserts  Savory Soups  [employer] - Meals in a Snap  Tasty Appetizers and Snacks  Comforting Weekend Breakfasts  One-Pot Wonders   Fast Evening Meals  Easy Entertaining  Personalizing Your Pritikin Plate  WORKSHOPS   Healthy Mindset (Psychosocial): New Thoughts, New Behaviors Clinical staff led group instruction and group discussion with PowerPoint presentation and patient guidebook. To enhance the learning environment the use of posters, models and videos may be added. Patients will learn and practice techniques for developing effective health and lifestyle goals. Patients will be able to effectively apply the goal setting process learned to develop at least one new personal goal.  The purpose of this  lesson is to expose patients to a new skill set of behavior modification techniques such as techniques setting SMART goals, overcoming barriers, and achieving new thoughts and new behaviors.  Managing Moods and Relationships Clinical staff led group instruction and group discussion with PowerPoint presentation and patient guidebook. To enhance the learning environment the use of posters, models and videos may be added. Patients will learn how emotional and chronic stress factors can impact their health and relationships. They will learn healthy ways to manage their moods and utilize positive coping mechanisms. In addition, ICR patients will learn ways to improve communication skills. The purpose of this lesson is to expose patients to ways of understanding how one's mood and health are intimately connected. Developing a healthy outlook can help build positive relationships and connections with others. Patients will understand the importance of utilizing effective communication skills that include actively listening and being heard. They will learn and understand the importance of the "4 Cs" and especially Connections in fostering of a Healthy Mind-Set.  Healthy Sleep for a Healthy Heart Clinical staff led group instruction and group discussion with PowerPoint presentation and patient guidebook. To enhance the learning environment the use of posters, models and videos may be added. At the conclusion of this workshop, patients will be able to demonstrate knowledge of the importance of sleep to overall health, well-being, and quality of life. They will understand the symptoms of, and treatments for, common sleep disorders. Patients will also be able to identify daytime and nighttime behaviors which impact sleep, and they will be able to apply these tools to help manage sleep-related challenges. The purpose of this lesson is to provide patients with a general overview of sleep and outline the importance of quality  sleep. Patients will learn about a few of the most common sleep disorders. Patients will also be introduced to the concept of "sleep hygiene," and discover ways to self-manage certain sleeping problems through simple daily behavior changes. Finally, the workshop will motivate patients by clarifying the links between quality sleep and their goals of heart-healthy living.   Recognizing and Reducing Stress Clinical staff led group instruction and group discussion with PowerPoint presentation and patient guidebook. To enhance the learning environment the use of posters, models and videos may be added. At the conclusion of this workshop, patients will be able to understand the types of stress reactions, differentiate between acute and chronic stress, and recognize the impact that chronic stress has on their health. They will also be able to apply different coping mechanisms, such as reframing negative self-talk. Patients will have the opportunity to practice a variety of stress management techniques, such as deep abdominal breathing, progressive muscle relaxation, and/or guided imagery.  The purpose of this  lesson is to educate patients on the role of stress in their lives and to provide healthy techniques for coping with it.  Learning Barriers/Preferences:  Learning Barriers/Preferences - 09/21/22 1400       Learning Barriers/Preferences   Learning Barriers Exercise Concerns   Patrick Noble says that he has some dizziness from taking beta blocker which was recently added   Learning Preferences Verbal Instruction;Pictoral;Audio;Video             Education Topics:  Knowledge Questionnaire Score:  Knowledge Questionnaire Score - 09/21/22 1322       Knowledge Questionnaire Score   Pre Score 20/24             Core Components/Risk Factors/Patient Goals at Admission:  Personal Goals and Risk Factors at Admission - 09/21/22 1322       Core Components/Risk Factors/Patient Goals on Admission     Weight Management Yes;Weight Loss    Intervention Weight Management: Provide education and appropriate resources to help participant work on and attain dietary goals.;Weight Management: Develop a combined nutrition and exercise program designed to reach desired caloric intake, while maintaining appropriate intake of nutrient and fiber, sodium and fats, and appropriate energy expenditure required for the weight goal.    Admit Weight 208 lb 15.9 oz (94.8 kg)    Goal Weight: Short Term 200 lb (90.7 kg)    Goal Weight: Long Term 195 lb (88.5 kg)    Expected Outcomes Short Term: Continue to assess and modify interventions until short term weight is achieved;Long Term: Adherence to nutrition and physical activity/exercise program aimed toward attainment of established weight goal;Weight Loss: Understanding of general recommendations for a balanced deficit meal plan, which promotes 1-2 lb weight loss per week and includes a negative energy balance of [phone] kcal/d    Hypertension Yes    Intervention Provide education on lifestyle modifcations including regular physical activity/exercise, weight management, moderate sodium restriction and increased consumption of fresh fruit, vegetables, and low fat dairy, alcohol moderation, and smoking cessation.;Monitor prescription use compliance.    Expected Outcomes Short Term: Continued assessment and intervention until BP is < 140/35m HG in hypertensive participants. < 130/873mHG in hypertensive participants with diabetes, heart failure or chronic kidney disease.;Long Term: Maintenance of blood pressure at goal levels.    Lipids Yes    Intervention Provide education and support for participant on nutrition & aerobic/resistive exercise along with prescribed medications to achieve LDL '70mg'$ , HDL >'40mg'$ .    Expected Outcomes Short Term: Participant states understanding of desired cholesterol values and is compliant with medications prescribed. Participant is following  exercise prescription and nutrition guidelines.;Long Term: Cholesterol controlled with medications as prescribed, with individualized exercise RX and with personalized nutrition plan. Value goals: LDL < '70mg'$ , HDL > 40 mg.             Core Components/Risk Factors/Patient Goals Review:    Core Components/Risk Factors/Patient Goals at Discharge (Final Review):    ITP Comments:  ITP Comments     Row Name 09/21/22 0953           ITP Comments Medical Director- Dr. TrFransico HimMD.  Introduction to Pritikin Education Program/Intensive Cardiac rehab. Initial Orientation packet reviewed with the patient.                Comments: Participant attended orientation for the cardiac rehabilitation program on  09/21/2022  to perform initial intake and exercise walk test. Patient introduced to the [place] and orientation packet was reviewed. Completed 6-minute  walk test, measurements, initial ITP, and exercise prescription. Vital signs stable. Telemetry-normal sinus rhythm, asymptomatic.   Service time was from 953 to 1139.

## 2022-09-27 ENCOUNTER — Other Ambulatory Visit (HOSPITAL_COMMUNITY): Payer: Self-pay

## 2022-09-27 ENCOUNTER — Encounter (HOSPITAL_COMMUNITY)
Admission: RE | Admit: 2022-09-27 | Discharge: 2022-09-27 | Disposition: A | Discharge status: Home / Self Care | Payer: PPO | Source: Ambulatory Visit | Attending: Internal Medicine | Admitting: Internal Medicine

## 2022-09-27 DIAGNOSIS — Z955 Presence of coronary angioplasty implant and graft: Secondary | ICD-10-CM

## 2022-09-27 NOTE — Progress Notes (Signed)
Daily Session Note  Patient Details  Name: Patrick Noble. MRN: [id number] Date of Birth: Apr 14, 1951 Referring Provider:   Flowsheet Row INTENSIVE CARDIAC REHAB ORIENT from 09/21/2022 in [facility]  Referring Provider Pixie Casino, MD       Encounter Date: 09/27/2022  Check In:  Session Check In - 09/27/22 1035       Check-In   Supervising physician immediately available to respond to emergencies [facility] - Physician supervision    Physician(s) Dr. Vanita Panda    Location [facility]    Staff Present Barnet Pall, RN, Deland Pretty, MS, ACSM-CEP, Exercise Physiologist;David Lilyan Punt, MS, ACSM-CEP, CCRP, Exercise Physiologist;Bailey Pearline Cables, MS, Exercise Physiologist;Jetta Walker BS, ACSM-CEP, Exercise Physiologist    Virtual Visit No    Medication changes reported     No    Fall or balance concerns reported    No    Tobacco Cessation No Change    Warm-up and Cool-down Performed as group-led instruction    Resistance Training Performed Yes    VAD Patient? No    PAD/SET Patient? No      Pain Assessment   Currently in Pain? No/denies    Pain Score 0-No pain    Multiple Pain Sites No             Capillary Blood Glucose: No results found for this or any previous visit (from the past 24 hour(s)).   Exercise Prescription Changes - 09/27/22 0959       Response to Exercise   Blood Pressure (Admit) 116/62    Blood Pressure (Exercise) 148/72    Blood Pressure (Exit) 110/66    Heart Rate (Admit) 73 bpm    Heart Rate (Exercise) 89 bpm    Heart Rate (Exit) 75 bpm    Rating of Perceived Exertion (Exercise) 10    Symptoms None    Comments Off to a great start with exercise.    Duration Continue with 30 min of aerobic exercise without signs/symptoms of physical distress.    Intensity THRR unchanged      Progression   Progression Continue to progress workloads to maintain intensity without  signs/symptoms of physical distress.    Average METs 2.1      Resistance Training   Training Prescription Yes    Weight 4 lbs    Reps 10-15    Time 10 Minutes      Interval Training   Interval Training No      NuStep   Level 2    SPM 76    Minutes 15    METs 1.9      Recumbant Elliptical   Level 1    RPM 48    Watts 57    Minutes 15    METs 2.4             Social History   Tobacco Use  Smoking Status Former  Smokeless Tobacco Never    Goals Met:  Exercise tolerated well No report of concerns or symptoms today Strength training completed today  Goals Unmet:  Not Applicable  Comments: Pt started cardiac rehab today.  Pt tolerated light exercise without difficulty. VSS, telemetry-Sinus Rhythm, asymptomatic.  Medication list reconciled. Pt denies barriers to medicaiton compliance.  PSYCHOSOCIAL ASSESSMENT:  PHQ-0. Pt exhibits positive coping skills, hopeful outlook with supportive family. No psychosocial needs identified at this time, no psychosocial interventions necessary.    Pt enjoys photography, playing the  guitar,and cars.   Pt oriented to exercise equipment and routine.    Understanding verbalized. Harrell Gave RN BSN    Dr. Fransico Him is Medical Director for Cardiac Rehab at [facility].

## 2022-09-28 ENCOUNTER — Encounter: Payer: Self-pay | Admitting: Internal Medicine

## 2022-09-29 ENCOUNTER — Encounter (HOSPITAL_COMMUNITY)
Admission: RE | Admit: 2022-09-29 | Discharge: 2022-09-29 | Disposition: A | Discharge status: Home / Self Care | Payer: PPO | Source: Ambulatory Visit | Attending: Internal Medicine | Admitting: Internal Medicine

## 2022-09-29 DIAGNOSIS — Z955 Presence of coronary angioplasty implant and graft: Secondary | ICD-10-CM

## 2022-10-01 ENCOUNTER — Encounter (HOSPITAL_COMMUNITY)
Admission: RE | Admit: 2022-10-01 | Discharge: 2022-10-01 | Disposition: A | Discharge status: Home / Self Care | Payer: PPO | Source: Ambulatory Visit | Attending: Internal Medicine | Admitting: Internal Medicine

## 2022-10-01 DIAGNOSIS — Z955 Presence of coronary angioplasty implant and graft: Secondary | ICD-10-CM | POA: Diagnosis not present

## 2022-10-06 ENCOUNTER — Encounter (HOSPITAL_COMMUNITY)
Admission: RE | Admit: 2022-10-06 | Discharge: 2022-10-06 | Disposition: A | Discharge status: Home / Self Care | Payer: PPO | Source: Ambulatory Visit | Attending: Internal Medicine | Admitting: Internal Medicine

## 2022-10-06 DIAGNOSIS — Z955 Presence of coronary angioplasty implant and graft: Secondary | ICD-10-CM

## 2022-10-08 ENCOUNTER — Encounter (HOSPITAL_COMMUNITY)
Admission: RE | Admit: 2022-10-08 | Discharge: 2022-10-08 | Disposition: A | Discharge status: Home / Self Care | Payer: PPO | Source: Ambulatory Visit | Attending: Internal Medicine | Admitting: Internal Medicine

## 2022-10-08 DIAGNOSIS — Z955 Presence of coronary angioplasty implant and graft: Secondary | ICD-10-CM | POA: Diagnosis not present

## 2022-10-12 NOTE — Progress Notes (Signed)
Cardiac Individual Treatment Plan  Patient Details  Name: Patrick Noble. MRN: [id number] Date of Birth: 07-Nov-1950 Referring Provider:   Flowsheet Row INTENSIVE CARDIAC REHAB ORIENT from 09/21/2022 in [facility] for Heart, Vascular, & [place]  Referring Provider Pixie Casino, MD       Initial Encounter Date:  [place] from 09/21/2022 in [facility], & [facility]  Date 09/21/22       Visit Diagnosis: 07/01/22 DES pLAD/OM2  Patient's Home Medications on Admission:  Current Outpatient Medications:    acetaminophen (TYLENOL) 500 MG tablet, Take 1,000 mg by mouth every 6 (six) hours as needed for moderate pain., Disp: , Rfl:    aspirin EC 81 MG tablet, Take 1 tablet (81 mg total) by mouth daily. Swallow whole., Disp: 90 tablet, Rfl: 3   atorvastatin (LIPITOR) 80 MG tablet, Take 1 tablet (80 mg total) by mouth daily., Disp: 90 tablet, Rfl: 3   Cholecalciferol (DIALYVITE VITAMIN D 5000) 125 MCG (5000 UT) capsule, Take 5,000 Units by mouth daily., Disp: , Rfl:    clonazePAM (KLONOPIN) 0.5 MG tablet, Take 0.25 mg by mouth at bedtime., Disp: , Rfl:    clopidogrel (PLAVIX) 75 MG tablet, Take 1 tablet (75 mg total) by mouth daily., Disp: 90 tablet, Rfl: 3   fluticasone (FLONASE) 50 MCG/ACT nasal spray, Place 1 spray into both nostrils daily as needed for allergies., Disp: , Rfl:    HYDROcodone-acetaminophen (NORCO/VICODIN) 5-325 MG tablet, Take 0.5-1 tablets by mouth every 6 (six) hours as needed for moderate pain. , Disp: , Rfl:    melatonin 5 MG TABS, Take 5 mg by mouth at bedtime., Disp: , Rfl:    metoprolol tartrate (LOPRESSOR) 25 MG tablet, Take 0.5 tablets (12.5 mg total) by mouth 2 (two) times daily., Disp: 90 tablet, Rfl: 3   Multiple Vitamins-Minerals (MULTIVITAMIN WITH MINERALS) tablet, Take 1 tablet by mouth daily., Disp: , Rfl:    Multiple Vitamins-Minerals (PRESERVISION  AREDS 2 PO), Take 1 capsule by mouth 2 (two) times daily., Disp: , Rfl:    nitroGLYCERIN (NITROSTAT) 0.4 MG SL tablet, Place 1 tablet (0.4 mg total) under the tongue every 5 (five) minutes as needed for chest pain., Disp: 30 tablet, Rfl: 2   PARoxetine (PAXIL) 40 MG tablet, Take 40 mg by mouth at bedtime. , Disp: , Rfl:    Vitamin D, Ergocalciferol, (DRISDOL) 1.25 MG (50000 UNIT) CAPS capsule, Take 1 capsule (50,000 Units total) by mouth every 7 (seven) days. Take for 4 weeks then start OTC vitamin D 1000 IU daily (Patient not taking: Reported on 09/20/2022), Disp: 4 capsule, Rfl: 0   Wheat Dextrin (BENEFIBER DRINK MIX PO), Take 1 Scoop by mouth daily., Disp: , Rfl:   Past Medical History: Past Medical History:  Diagnosis Date   Coronary artery disease    Degenerative arthritis of lumbar spine    Hyperlipidemia    Hypertension    Kidney stone    Prostate pain    Scoliosis     Tobacco Use: Social History   Tobacco Use  Smoking Status Former  Smokeless Tobacco Never    Labs: Review Flowsheet        No data to display          Capillary Blood Glucose: No results found for: "GLUCAP"   Exercise Target Goals: Exercise Program Goal: Individual exercise prescription set using results from initial 6 min walk test  and THRR while considering  patient's activity barriers and safety.   Exercise Prescription Goal: Initial exercise prescription builds to 30-45 minutes a day of aerobic activity, 2-3 days per week.  Home exercise guidelines will be given to patient during program as part of exercise prescription that the participant will acknowledge.  Activity Barriers & Risk Stratification:  Activity Barriers & Cardiac Risk Stratification - 09/21/22 1100       Activity Barriers & Cardiac Risk Stratification   Activity Barriers Other (comment);Back Problems    Comments Chronic bilateral knee pain, scoliosis of the thoracic region, chronic low back pain, L5-S1 degenerative disc  disease.    Cardiac Risk Stratification High             6 Minute Walk:  6 Minute Walk     Row Name 09/21/22 1110         6 Minute Walk   Phase Initial     Distance 1526 feet     Walk Time 6 minutes     # of Rest Breaks 0     MPH 2.89     METS 3.9     RPE 11     Perceived Dyspnea  1     VO2 Peak 13.66     Symptoms Yes (comment)     Comments Mild shortness of breath, RPD=1; mild back pain, chronic= 1/10 on the pain scale. Symptoms resolved with rest.     Resting HR 81 bpm     Resting BP 98/64     Resting Oxygen Saturation  98 %     Exercise Oxygen Saturation  during 6 min walk 97 %     Max Ex. HR 103 bpm     Max Ex. BP 158/70     2 Minute Post BP 112/68              Oxygen Initial Assessment:   Oxygen Re-Evaluation:   Oxygen Discharge (Final Oxygen Re-Evaluation):   Initial Exercise Prescription:  Initial Exercise Prescription - 09/21/22 1300       Date of Initial Exercise RX and Referring Provider   Date 09/21/22    Referring Provider Pixie Casino, MD    Expected Discharge Date 11/19/21      Recumbant Bike   Level --    Watts --    Minutes --    METs --      NuStep   Level 2    SPM 85    Minutes 15    METs 2.5      Recumbant Elliptical   Level 1    Watts 25    Minutes 15    METs 2.8      Prescription Details   Frequency (times per week) 3    Duration Progress to 30 minutes of continuous aerobic without signs/symptoms of physical distress      Intensity   THRR 40-80% of Max Heartrate 60-121    Ratings of Perceived Exertion 11-13    Perceived Dyspnea 0-4      Progression   Progression Continue to progress workloads to maintain intensity without signs/symptoms of physical distress.      Resistance Training   Training Prescription Yes    Weight 4 lbs    Reps 10-15             Perform Capillary Blood Glucose checks as needed.  Exercise Prescription Changes:   Exercise Prescription Changes     Row Name 09/27/22  [phone] 10/08/22  1009           Response to Exercise   Blood Pressure (Admit) 116/62 110/70      Blood Pressure (Exercise) 148/72 142/78      Blood Pressure (Exit) 110/66 121/75      Heart Rate (Admit) 73 bpm 68 bpm      Heart Rate (Exercise) 89 bpm 98 bpm      Heart Rate (Exit) 75 bpm 81 bpm      Rating of Perceived Exertion (Exercise) 10 12      Symptoms None None      Comments Off to a great start with exercise. --      Duration Continue with 30 min of aerobic exercise without signs/symptoms of physical distress. Continue with 30 min of aerobic exercise without signs/symptoms of physical distress.      Intensity THRR unchanged THRR unchanged        Progression   Progression Continue to progress workloads to maintain intensity without signs/symptoms of physical distress. Continue to progress workloads to maintain intensity without signs/symptoms of physical distress.      Average METs 2.1 2.8        Resistance Training   Training Prescription Yes Yes      Weight 4 lbs 4 lbs      Reps 10-15 10-15      Time 10 Minutes 10 Minutes        Interval Training   Interval Training No No        NuStep   Level 2 2      SPM 76 91      Minutes 15 15      METs 1.9 2.1        Recumbant Elliptical   Level 1 2      RPM 48 53      Watts 57 88      Minutes 15 15      METs 2.4 3.5               Exercise Comments:   Exercise Comments     Row Name 09/27/22 1302 09/29/22 1035         Exercise Comments Patient tolerated low intensity exercise well, with some low back pain, which is chronic for him. Reviewed METs with patient.               Exercise Goals and Review:   Exercise Goals     Row Name 09/21/22 1100             Exercise Goals   Increase Physical Activity Yes       Intervention Provide advice, education, support and counseling about physical activity/exercise needs.;Develop an individualized exercise prescription for aerobic and resistive training based on  initial evaluation findings, risk stratification, comorbidities and participant's personal goals.       Expected Outcomes Short Term: Attend rehab on a regular basis to increase amount of physical activity.;Long Term: Exercising regularly at least 3-5 days a week.       Increase Strength and Stamina Yes       Intervention Provide advice, education, support and counseling about physical activity/exercise needs.;Develop an individualized exercise prescription for aerobic and resistive training based on initial evaluation findings, risk stratification, comorbidities and participant's personal goals.       Expected Outcomes Short Term: Increase workloads from initial exercise prescription for resistance, speed, and METs.;Short Term: Perform resistance training exercises routinely during rehab and add in resistance training at  home;Long Term: Improve cardiorespiratory fitness, muscular endurance and strength as measured by increased METs and functional capacity (6MWT)       Able to understand and use rate of perceived exertion (RPE) scale Yes       Intervention Provide education and explanation on how to use RPE scale       Expected Outcomes Short Term: Able to use RPE daily in rehab to express subjective intensity level;Long Term:  Able to use RPE to guide intensity level when exercising independently       Knowledge and understanding of Target Heart Rate Range (THRR) Yes       Intervention Provide education and explanation of THRR including how the numbers were predicted and where they are located for reference       Expected Outcomes Short Term: Able to state/look up THRR;Long Term: Able to use THRR to govern intensity when exercising independently;Short Term: Able to use daily as guideline for intensity in rehab       Able to check pulse independently Yes       Intervention Provide education and demonstration on how to check pulse in carotid and radial arteries.;Review the importance of being able to  check your own pulse for safety during independent exercise       Expected Outcomes Short Term: Able to explain why pulse checking is important during independent exercise;Long Term: Able to check pulse independently and accurately       Understanding of Exercise Prescription Yes       Intervention Provide education, explanation, and written materials on patient's individual exercise prescription       Expected Outcomes Short Term: Able to explain program exercise prescription;Long Term: Able to explain home exercise prescription to exercise independently                Exercise Goals Re-Evaluation :  Exercise Goals Re-Evaluation     Row Name 09/27/22 1302             Exercise Goal Re-Evaluation   Exercise Goals Review Increase Physical Activity;Able to understand and use rate of perceived exertion (RPE) scale       Comments Patient able to understand and use RPE scale appropriately.       Expected Outcomes Progress workloads as tolerated to increase cardiorespiratory fitness.                Discharge Exercise Prescription (Final Exercise Prescription Changes):  Exercise Prescription Changes - 10/08/22 1009       Response to Exercise   Blood Pressure (Admit) 110/70    Blood Pressure (Exercise) 142/78    Blood Pressure (Exit) 121/75    Heart Rate (Admit) 68 bpm    Heart Rate (Exercise) 98 bpm    Heart Rate (Exit) 81 bpm    Rating of Perceived Exertion (Exercise) 12    Symptoms None    Duration Continue with 30 min of aerobic exercise without signs/symptoms of physical distress.    Intensity THRR unchanged      Progression   Progression Continue to progress workloads to maintain intensity without signs/symptoms of physical distress.    Average METs 2.8      Resistance Training   Training Prescription Yes    Weight 4 lbs    Reps 10-15    Time 10 Minutes      Interval Training   Interval Training No      NuStep   Level 2    SPM 91  Minutes 15    METs 2.1       Recumbant Elliptical   Level 2    RPM 53    Watts 88    Minutes 15    METs 3.5             Nutrition:  Target Goals: Understanding of nutrition guidelines, daily intake of sodium '1500mg'$ , cholesterol '200mg'$ , calories 30% from fat and 7% or less from saturated fats, daily to have 5 or more servings of fruits and vegetables.  Biometrics:  Pre Biometrics - 09/21/22 0953       Pre Biometrics   Waist Circumference 41.75 inches    Hip Circumference 42 inches    Waist to Hip Ratio 0.99 %    Triceps Skinfold 11 mm    % Body Fat 25.5 %    Grip Strength 52 kg    Flexibility --   Not performed, chronic back pain.   Single Leg Stand 14.37 seconds              Nutrition Therapy Plan and Nutrition Goals:  Nutrition Therapy & Goals - 09/27/22 1543       Nutrition Therapy   Diet Heart Healthy Diet    Drug/Food Interactions Statins/Certain Fruits      Personal Nutrition Goals   Nutrition Goal Patient to identify strategies for reducing cardiovascular risk by attending the Pritikin education and nutrition series    Personal Goal #2 Patient to improve diet quality by using the plate method as a guide for meal planning to including lean protein/plant protein, fruits, vegetables, whole gains and nonfat dairy.    Personal Goal #3 Patient to identify food sources of saturated fat, trans fat, sodium, and refined carbohydrates    Personal Goal #4 Patient to identify strategies for weight loss of 0.5-2.0# per week    Comments Patient reports eating a wide variety of high fiber foods and mindfulness of sodium and sugar. He reports eagerness to learn more about heart healthy nutrition. His wife and daughter remain a good support toward lifestyle changes. He is motivated to lose weight with goal weight of 195#.      Intervention Plan   Intervention Prescribe, educate and counsel regarding individualized specific dietary modifications aiming towards targeted core components such as  weight, hypertension, lipid management, diabetes, heart failure and other comorbidities.;Nutrition handout(s) given to patient.    Expected Outcomes Short Term Goal: Understand basic principles of dietary content, such as calories, fat, sodium, cholesterol and nutrients.;Long Term Goal: Adherence to prescribed nutrition plan.             Nutrition Assessments:  Nutrition Assessments - 09/27/22 1128       Rate Your Plate Scores   Pre Score 65            MEDIFICTS Score Key: ?70 Need to make dietary changes  40-70 Heart Healthy Diet ? 40 Therapeutic Level Cholesterol Diet   Flowsheet Row INTENSIVE CARDIAC REHAB from 09/27/2022 in [facility], V[facility], & Lung Health  Picture Your Plate Total Score on Admission 65      Picture Your Plate Scores: <40 Unhealthy dietary pattern with much room for improvement. 41-50 Dietary pattern unlikely to meet recommendations for good health and room for improvement. 51-60 More healthful dietary pattern, with some room for improvement.  >60 Healthy dietary pattern, although there may be some specific behaviors that could be improved.    Nutrition Goals Re-Evaluation:  Nutrition Goals Re-Evaluation  [place] Name 09/27/22 1543             Goals   Current Weight 206 lb 12.7 oz (93.8 kg)       Comment Per cardiology notes, most recent labs from October 2022 include cholesterol 178, triglycerides 172, HDL 32, LDL 115, A1c 5.8       Expected Outcome Patient reports eating a wide variety of high fiber foods and mindfulness of sodium and sugar. He reports eagerness to learn more about heart healthy nutrition. His wife and daughter remain a good support toward lifestyle changes. He is motivated to lose weight with goal weight of 195#. Mr. Monforte will benefit from attending Pritikin ICR and adherance to the Pritikin eating plan to support improved lipid panel with LDL <70, HDL >40, improved diet quality for blood  sugar support and weight loss.                Nutrition Goals Re-Evaluation:  Nutrition Goals Re-Evaluation     [place] Name 09/27/22 1543             Goals   Current Weight 206 lb 12.7 oz (93.8 kg)       Comment Per cardiology notes, most recent labs from October 2022 include cholesterol 178, triglycerides 172, HDL 32, LDL 115, A1c 5.8       Expected Outcome Patient reports eating a wide variety of high fiber foods and mindfulness of sodium and sugar. He reports eagerness to learn more about heart healthy nutrition. His wife and daughter remain a good support toward lifestyle changes. He is motivated to lose weight with goal weight of 195#. Mr. Urwin will benefit from attending Pritikin ICR and adherance to the Pritikin eating plan to support improved lipid panel with LDL <70, HDL >40, improved diet quality for blood sugar support and weight loss.                Nutrition Goals Discharge (Final Nutrition Goals Re-Evaluation):  Nutrition Goals Re-Evaluation - 09/27/22 1543       Goals   Current Weight 206 lb 12.7 oz (93.8 kg)    Comment Per cardiology notes, most recent labs from October 2022 include cholesterol 178, triglycerides 172, HDL 32, LDL 115, A1c 5.8    Expected Outcome Patient reports eating a wide variety of high fiber foods and mindfulness of sodium and sugar. He reports eagerness to learn more about heart healthy nutrition. His wife and daughter remain a good support toward lifestyle changes. He is motivated to lose weight with goal weight of 195#. Mr. Buttram will benefit from attending Pritikin ICR and adherance to the Pritikin eating plan to support improved lipid panel with LDL <70, HDL >40, improved diet quality for blood sugar support and weight loss.             Psychosocial: Target Goals: Acknowledge presence or absence of significant depression and/or stress, maximize coping skills, provide positive support system. Participant is able to verbalize types and  ability to use techniques and skills needed for reducing stress and depression.  Initial Review & Psychosocial Screening:  Initial Psych Review & Screening - 09/21/22 1359       Initial Review   Current issues with Current Anxiety/Panic;History of Depression      Family Dynamics   Good Support System? Yes   Barnabas Lister has his wife and daughter for support   Comments Barnabas Lister is currently taking an antidepressant. Barnabas Lister denies being depressed at this time.  Barriers   Psychosocial barriers to participate in program There are no identifiable barriers or psychosocial needs.      Screening Interventions   Interventions Encouraged to exercise             Quality of Life Scores:  Quality of Life - 09/21/22 1321       Quality of Life   Select Quality of Life      Quality of Life Scores   Health/Function Pre 25.47 %    Socioeconomic Pre 25.79 %    Psych/Spiritual Pre 24.71 %    Family Pre 27.6 %    GLOBAL Pre 25.69 %            Scores of 19 and below usually indicate a poorer quality of life in these areas.  A difference of  2-3 points is a clinically meaningful difference.  A difference of 2-3 points in the total score of the Quality of Life Index has been associated with significant improvement in overall quality of life, self-image, physical symptoms, and general health in studies assessing change in quality of life.  PHQ-9: Review Flowsheet       09/21/2022  Depression screen PHQ 2/9  Decreased Interest 0  Down, Depressed, Hopeless 0  PHQ - 2 Score 0   Interpretation of Total Score  Total Score Depression Severity:  1-4 = Minimal depression, 5-9 = Mild depression, 10-14 = Moderate depression, 15-19 = Moderately severe depression, 20-27 = Severe depression   Psychosocial Evaluation and Intervention:   Psychosocial Re-Evaluation:  Psychosocial Re-Evaluation     [place] Name 09/27/22 1405 10/12/22 6967           Psychosocial Re-Evaluation   Current issues with  Current Anxiety/Panic;History of Depression Current Anxiety/Panic;History of Depression      Comments Barnabas Lister did not voice any concerns on his first day of exercise at intensive cardiac rehab Barnabas Lister continues not to voce any concerns or stressors  at intensive cardiac rehab      Expected Outcomes Adrain will have controlled or decreased anxiety/ depression upon completion of intensive cardiac rehab Saafir will have controlled or decreased anxiety/ depression upon completion of intensive cardiac rehab      Interventions Encouraged to attend Cardiac Rehabilitation for the exercise;Stress management education;Relaxation education Encouraged to attend Cardiac Rehabilitation for the exercise;Stress management education;Relaxation education      Continue Psychosocial Services  No Follow up required No Follow up required               Psychosocial Discharge (Final Psychosocial Re-Evaluation):  Psychosocial Re-Evaluation - 10/12/22 0824       Psychosocial Re-Evaluation   Current issues with Current Anxiety/Panic;History of Depression    Comments Barnabas Lister continues not to voce any concerns or stressors  at intensive cardiac rehab    Expected Outcomes Yuki will have controlled or decreased anxiety/ depression upon completion of intensive cardiac rehab    Interventions Encouraged to attend Cardiac Rehabilitation for the exercise;Stress management education;Relaxation education    Continue Psychosocial Services  No Follow up required             Vocational Rehabilitation: Provide vocational rehab assistance to qualifying candidates.   Vocational Rehab Evaluation & Intervention:  Vocational Rehab - 09/21/22 1402       Initial Vocational Rehab Evaluation & Intervention   Assessment shows need for Vocational Rehabilitation No   Barnabas Lister is retired and does not need vocational rehab at this time  Education: Education Goals: Education classes will be provided on a weekly basis, covering required  topics. Participant will state understanding/return demonstration of topics presented.    Education     Row Name 09/27/22 1200     Education   Cardiac Education Topics Pritikin   IT sales professional Nutrition   Nutrition Workshop Fueling a Designer, multimedia   Instruction Review Code 1- Information systems manager   Class Start Time 1145   Class Stop Time 1230   Class Time Calculation (min) 45 min    [place] Name 09/29/22 1200     Education   Cardiac Education Topics Pritikin   Financial trader   Weekly Topic International Cuisine- Spotlight on the [place]ones   Instruction Review Code 1- Verbalizes Understanding   Class Start Time 1135   Class Stop Time 1215   Class Time Calculation (min) 40 min    [place] Name 10/01/22 1200     Education   Cardiac Education Topics Pritikin   Select Workshops     Workshops   Educator Exercise Physiologist   Select Psychosocial   Psychosocial Workshop Recognizing and Reducing Stress   Instruction Review Code 1- Verbalizes Understanding   Class Start Time 1143   Class Stop Time 1228   Class Time Calculation (min) 45 min    [place] Name 10/06/22 1200     Education   Cardiac Education Topics Pritikin   Charity fundraiser Exercise Physiologist   Select Nutrition   Nutrition Cooking - Healthy Salads and Dressing   Instruction Review Code 1- Verbalizes Understanding   Class Start Time 1135   Class Stop Time 1220   Class Time Calculation (min) 45 min    [place] Name 10/08/22 1200     Education   Cardiac Education Topics Pritikin   Architect Education   General Education Heart Disease Risk Reduction   Instruction Review Code 1- Verbalizes Understanding   Class Start Time 1132   Class Stop Time 1205   Class Time Calculation (min) 33 min             Core Videos: Exercise    Move It!  Clinical staff conducted group or individual video education with verbal and written material and guidebook.  Patient learns the recommended Pritikin exercise program. Exercise with the goal of living a long, healthy life. Some of the health benefits of exercise include controlled diabetes, healthier blood pressure levels, improved cholesterol levels, improved heart and lung capacity, improved sleep, and better body composition. Everyone should speak with their doctor before starting or changing an exercise routine.  Biomechanical Limitations Clinical staff conducted group or individual video education with verbal and written material and guidebook.  Patient learns how biomechanical limitations can impact exercise and how we can mitigate and possibly overcome limitations to have an impactful and balanced exercise routine.  Body Composition Clinical staff conducted group or individual video education with verbal and written material and guidebook.  Patient learns that body composition (ratio of muscle mass to fat mass) is a key component to assessing overall fitness, rather than body weight alone. Increased fat mass, especially visceral belly fat, can put [place] at increased risk for metabolic syndrome, type 2 diabetes, heart disease, and even  death. It is recommended to combine diet and exercise (cardiovascular and resistance training) to improve your body composition. Seek guidance from your physician and exercise physiologist before implementing an exercise routine.  Exercise Action Plan Clinical staff conducted group or individual video education with verbal and written material and guidebook.  Patient learns the recommended strategies to achieve and enjoy long-term exercise adherence, including variety, self-motivation, self-efficacy, and positive decision making. Benefits of exercise include fitness, good health, weight management, more energy, better sleep,  less stress, and overall well-being.  Medical   Heart Disease Risk Reduction Clinical staff conducted group or individual video education with verbal and written material and guidebook.  Patient learns our heart is our most vital organ as it circulates oxygen, nutrients, white blood cells, and hormones throughout the entire body, and carries waste away. Data supports a plant-based eating plan like the Pritikin Program for its effectiveness in slowing progression of and reversing heart disease. The video provides a number of recommendations to address heart disease.   Metabolic Syndrome and Belly Fat  Clinical staff conducted group or individual video education with verbal and written material and guidebook.  Patient learns what metabolic syndrome is, how it leads to heart disease, and how one can reverse it and keep it from coming back. You have metabolic syndrome if you have 3 of the following 5 criteria: abdominal obesity, high blood pressure, high triglycerides, low HDL cholesterol, and high blood sugar.  Hypertension and Heart Disease Clinical staff conducted group or individual video education with verbal and written material and guidebook.  Patient learns that high blood pressure, or hypertension, is very common in the [place]. Hypertension is largely due to excessive salt intake, but other important risk factors include being overweight, physical inactivity, drinking too much alcohol, smoking, and not eating enough potassium from fruits and vegetables. High blood pressure is a leading risk factor for heart attack, stroke, congestive heart failure, dementia, kidney failure, and premature death. Long-term effects of excessive salt intake include stiffening of the arteries and thickening of heart muscle and organ damage. Recommendations include ways to reduce hypertension and the risk of heart disease.  Diseases of Our Time - Focusing on Diabetes Clinical staff conducted group or individual  video education with verbal and written material and guidebook.  Patient learns why the best way to stop diseases of our time is prevention, through food and other lifestyle changes. Medicine (such as prescription pills and surgeries) is often only a Band-Aid on the problem, not a long-term solution. Most common diseases of our time include obesity, type 2 diabetes, hypertension, heart disease, and cancer. The Pritikin Program is recommended and has been proven to help reduce, reverse, and/or prevent the damaging effects of metabolic syndrome.  Nutrition   Overview of the Pritikin Eating Plan  Clinical staff conducted group or individual video education with verbal and written material and guidebook.  Patient learns about the [place] for disease risk reduction. The [place] emphasizes a wide variety of unrefined, minimally-processed carbohydrates, like fruits, vegetables, whole grains, and legumes. Go, Caution, and Stop food choices are explained. Plant-based and lean animal proteins are emphasized. Rationale provided for low sodium intake for blood pressure control, low added sugars for blood sugar stabilization, and low added fats and oils for coronary artery disease risk reduction and weight management.  Calorie Density  Clinical staff conducted group or individual video education with verbal and written material and guidebook.  Patient learns about calorie density and  how it impacts the Pritikin Eating Plan. Knowing the characteristics of the food you choose will help you decide whether those foods will lead to weight gain or weight loss, and whether you want to consume more or less of them. Weight loss is usually a side effect of the Pritikin Eating Plan because of its focus on low calorie-dense foods.  Label Reading  Clinical staff conducted group or individual video education with verbal and written material and guidebook.  Patient learns about the Pritikin recommended  label reading guidelines and corresponding recommendations regarding calorie density, added sugars, sodium content, and whole grains.  Dining Out - Part 1  Clinical staff conducted group or individual video education with verbal and written material and guidebook.  Patient learns that restaurant meals can be sabotaging because they can be so high in calories, fat, sodium, and/or sugar. Patient learns recommended strategies on how to positively address this and avoid unhealthy pitfalls.  Facts on Fats  Clinical staff conducted group or individual video education with verbal and written material and guidebook.  Patient learns that lifestyle modifications can be just as effective, if not more so, as many medications for lowering your risk of heart disease. A Pritikin lifestyle can help to reduce your risk of inflammation and atherosclerosis (cholesterol build-up, or plaque, in the artery walls). Lifestyle interventions such as dietary choices and physical activity address the cause of atherosclerosis. A review of the types of fats and their impact on blood cholesterol levels, along with dietary recommendations to reduce fat intake is also included.  Nutrition Action Plan  Clinical staff conducted group or individual video education with verbal and written material and guidebook.  Patient learns how to incorporate Pritikin recommendations into their lifestyle. Recommendations include planning and keeping personal health goals in mind as an important part of their success.  Healthy Mind-Set    Healthy Minds, Bodies, Hearts  Clinical staff conducted group or individual video education with verbal and written material and guidebook.  Patient learns how to identify when they are stressed. Video will discuss the impact of that stress, as well as the many benefits of stress management. Patient will also be introduced to stress management techniques. The way we think, act, and feel has an impact on our  hearts.  How Our Thoughts Can Heal Our Hearts  Clinical staff conducted group or individual video education with verbal and written material and guidebook.  Patient learns that negative thoughts can cause depression and anxiety. This can result in negative lifestyle behavior and serious health problems. Cognitive behavioral therapy is an effective method to help control our thoughts in order to change and improve our emotional outlook.  Additional Videos:  Exercise    Improving Performance  Clinical staff conducted group or individual video education with verbal and written material and guidebook.  Patient learns to use a non-linear approach by alternating intensity levels and lengths of time spent exercising to help burn more calories and lose more body fat. Cardiovascular exercise helps improve heart health, metabolism, hormonal balance, blood sugar control, and recovery from fatigue. Resistance training improves strength, endurance, balance, coordination, reaction time, metabolism, and muscle mass. Flexibility exercise improves circulation, posture, and balance. Seek guidance from your physician and exercise physiologist before implementing an exercise routine and learn your capabilities and proper form for all exercise.  Introduction to Yoga  Clinical staff conducted group or individual video education with verbal and written material and guidebook.  Patient learns about yoga, a discipline of the coming  together of mind, breath, and body. The benefits of yoga include improved flexibility, improved range of motion, better posture and core strength, increased lung function, weight loss, and positive self-image. Yoga's heart health benefits include lowered blood pressure, healthier heart rate, decreased cholesterol and triglyceride levels, improved immune function, and reduced stress. Seek guidance from your physician and exercise physiologist before implementing an exercise routine and learn your  capabilities and proper form for all exercise.  Medical   Aging: Enhancing Your Quality of Life  Clinical staff conducted group or individual video education with verbal and written material and guidebook.  Patient learns key strategies and recommendations to stay in good physical health and enhance quality of life, such as prevention strategies, having an advocate, securing a [place], and keeping a list of medications and system for tracking them. It also discusses how to avoid risk for bone loss.  Biology of Weight Control  Clinical staff conducted group or individual video education with verbal and written material and guidebook.  Patient learns that weight gain occurs because we consume more calories than we burn (eating more, moving less). Even if your body weight is normal, you may have higher ratios of fat compared to muscle mass. Too much body fat puts you at increased risk for cardiovascular disease, heart attack, stroke, type 2 diabetes, and obesity-related cancers. In addition to exercise, following the [place] can help reduce your risk.  Decoding Lab Results  Clinical staff conducted group or individual video education with verbal and written material and guidebook.  Patient learns that lab test reflects one measurement whose values change over time and are influenced by many factors, including medication, stress, sleep, exercise, food, hydration, pre-existing medical conditions, and more. It is recommended to use the knowledge from this video to become more involved with your lab results and evaluate your numbers to speak with your doctor.   Diseases of Our Time - Overview  Clinical staff conducted group or individual video education with verbal and written material and guidebook.  Patient learns that according to the CDC, 50% to 70% of chronic diseases (such as obesity, type 2 diabetes, elevated lipids, hypertension, and heart disease) are  avoidable through lifestyle improvements including healthier food choices, listening to satiety cues, and increased physical activity.  Sleep Disorders Clinical staff conducted group or individual video education with verbal and written material and guidebook.  Patient learns how good quality and duration of sleep are important to overall health and well-being. Patient also learns about sleep disorders and how they impact health along with recommendations to address them, including discussing with a physician.  Nutrition  Dining Out - Part 2 Clinical staff conducted group or individual video education with verbal and written material and guidebook.  Patient learns how to plan ahead and communicate in order to maximize their dining experience in a healthy and nutritious manner. Included are recommended food choices based on the type of restaurant the patient is visiting.   Fueling a Best boy conducted group or individual video education with verbal and written material and guidebook.  There is a strong connection between our food choices and our health. Diseases like obesity and type 2 diabetes are very prevalent and are in large-part due to lifestyle choices. The Pritikin Eating Plan provides plenty of food and hunger-curbing satisfaction. It is easy to follow, affordable, and helps reduce health risks.  Menu Workshop  Clinical staff conducted group or individual video  education with verbal and written material and guidebook.  Patient learns that restaurant meals can sabotage health goals because they are often packed with calories, fat, sodium, and sugar. Recommendations include strategies to plan ahead and to communicate with the manager, chef, or server to help order a healthier meal.  Planning Your Eating Strategy  Clinical staff conducted group or individual video education with verbal and written material and guidebook.  Patient learns about the [place] and  its benefit of reducing the risk of disease. The [place] does not focus on calories. Instead, it emphasizes high-quality, nutrient-rich foods. By knowing the characteristics of the foods, we choose, we can determine their calorie density and make informed decisions.  Targeting Your Nutrition Priorities  Clinical staff conducted group or individual video education with verbal and written material and guidebook.  Patient learns that lifestyle habits have a tremendous impact on disease risk and progression. This video provides eating and physical activity recommendations based on your personal health goals, such as reducing LDL cholesterol, losing weight, preventing or controlling type 2 diabetes, and reducing high blood pressure.  Vitamins and Minerals  Clinical staff conducted group or individual video education with verbal and written material and guidebook.  Patient learns different ways to obtain key vitamins and minerals, including through a recommended healthy diet. It is important to discuss all supplements you take with your doctor.   Healthy Mind-Set    Smoking Cessation  Clinical staff conducted group or individual video education with verbal and written material and guidebook.  Patient learns that cigarette smoking and tobacco addiction pose a serious health risk which affects millions of people. Stopping smoking will significantly reduce the risk of heart disease, lung disease, and many forms of cancer. Recommended strategies for quitting are covered, including working with your doctor to develop a successful plan.  Culinary   Becoming a Financial trader conducted group or individual video education with verbal and written material and guidebook.  Patient learns that cooking at home can be healthy, cost-effective, quick, and puts them in control. Keys to cooking healthy recipes will include looking at your recipe, assessing your equipment needs, planning ahead,  making it simple, choosing cost-effective seasonal ingredients, and limiting the use of added fats, salts, and sugars.  Cooking - Breakfast and Snacks  Clinical staff conducted group or individual video education with verbal and written material and guidebook.  Patient learns how important breakfast is to satiety and nutrition through the entire day. Recommendations include key foods to eat during breakfast to help stabilize blood sugar levels and to prevent overeating at meals later in the day. Planning ahead is also a key component.  Cooking - Human resources officer conducted group or individual video education with verbal and written material and guidebook.  Patient learns eating strategies to improve overall health, including an approach to cook more at home. Recommendations include thinking of animal protein as a side on your plate rather than center stage and focusing instead on lower calorie dense options like vegetables, fruits, whole grains, and plant-based proteins, such as beans. Making sauces in large quantities to freeze for later and leaving the skin on your vegetables are also recommended to maximize your experience.  Cooking - Healthy Salads and Dressing Clinical staff conducted group or individual video education with verbal and written material and guidebook.  Patient learns that vegetables, fruits, whole grains, and legumes are the foundations of the [place]. Recommendations include  how to incorporate each of these in flavorful and healthy salads, and how to create homemade salad dressings. Proper handling of ingredients is also covered. Cooking - Soups and [employer] - Soups and Desserts Clinical staff conducted group or individual video education with verbal and written material and guidebook.  Patient learns that Pritikin soups and desserts make for easy, nutritious, and delicious snacks and meal components that are low in sodium, fat, sugar, and  calorie density, while high in vitamins, minerals, and filling fiber. Recommendations include simple and healthy ideas for soups and desserts.   Overview     The Pritikin Solution Program Overview Clinical staff conducted group or individual video education with verbal and written material and guidebook.  Patient learns that the results of the [place] Program have been documented in more than 100 articles published in peer-reviewed journals, and the benefits include reducing risk factors for (and, in some cases, even reversing) high cholesterol, high blood pressure, type 2 diabetes, obesity, and more! An overview of the three key pillars of the Pritikin Program will be covered: eating well, doing regular exercise, and having a healthy mind-set.  WORKSHOPS  Exercise: Exercise Basics: Building Your Action Plan Clinical staff led group instruction and group discussion with PowerPoint presentation and patient guidebook. To enhance the learning environment the use of posters, models and videos may be added. At the conclusion of this workshop, patients will comprehend the difference between physical activity and exercise, as well as the benefits of incorporating both, into their routine. Patients will understand the FITT (Frequency, Intensity, Time, and Type) principle and how to use it to build an exercise action plan. In addition, safety concerns and other considerations for exercise and cardiac rehab will be addressed by the presenter. The purpose of this lesson is to promote a comprehensive and effective weekly exercise routine in order to improve patients' overall level of fitness.   Managing Heart Disease: Your Path to a Healthier Heart Clinical staff led group instruction and group discussion with PowerPoint presentation and patient guidebook. To enhance the learning environment the use of posters, models and videos may be added.At the conclusion of this workshop, patients will understand the  anatomy and physiology of the heart. Additionally, they will understand how Pritikin's three pillars impact the risk factors, the progression, and the management of heart disease.  The purpose of this lesson is to provide a high-level overview of the heart, heart disease, and how the Pritikin lifestyle positively impacts risk factors.  Exercise Biomechanics Clinical staff led group instruction and group discussion with PowerPoint presentation and patient guidebook. To enhance the learning environment the use of posters, models and videos may be added. Patients will learn how the structural parts of their bodies function and how these functions impact their daily activities, movement, and exercise. Patients will learn how to promote a neutral spine, learn how to manage pain, and identify ways to improve their physical movement in order to promote healthy living. The purpose of this lesson is to expose patients to common physical limitations that impact physical activity. Participants will learn practical ways to adapt and manage aches and pains, and to minimize their effect on regular exercise. Patients will learn how to maintain good posture while sitting, walking, and lifting.  Balance Training and Fall Prevention  Clinical staff led group instruction and group discussion with PowerPoint presentation and patient guidebook. To enhance the learning environment the use of posters, models and videos may be added. At the conclusion of  this workshop, patients will understand the importance of their sensorimotor skills (vision, proprioception, and the vestibular system) in maintaining their ability to balance as they age. Patients will apply a variety of balancing exercises that are appropriate for their current level of function. Patients will understand the common causes for poor balance, possible solutions to these problems, and ways to modify their physical environment in order to minimize their  fall risk. The purpose of this lesson is to teach patients about the importance of maintaining balance as they age and ways to minimize their risk of falling.  WORKSHOPS   Nutrition:  Fueling a Scientist, research (physical sciences) led group instruction and group discussion with PowerPoint presentation and patient guidebook. To enhance the learning environment the use of posters, models and videos may be added. Patients will review the foundational principles of the [place] and understand what constitutes a serving size in each of the food groups. Patients will also learn Pritikin-friendly foods that are better choices when away from home and review make-ahead meal and snack options. Calorie density will be reviewed and applied to three nutrition priorities: weight maintenance, weight loss, and weight gain. The purpose of this lesson is to reinforce (in a group setting) the key concepts around what patients are recommended to eat and how to apply these guidelines when away from home by planning and selecting Pritikin-friendly options. Patients will understand how calorie density may be adjusted for different weight management goals.  Mindful Eating  Clinical staff led group instruction and group discussion with PowerPoint presentation and patient guidebook. To enhance the learning environment the use of posters, models and videos may be added. Patients will briefly review the concepts of the [place] and the importance of low-calorie dense foods. The concept of mindful eating will be introduced as well as the importance of paying attention to internal hunger signals. Triggers for non-hunger eating and techniques for dealing with triggers will be explored. The purpose of this lesson is to provide patients with the opportunity to review the basic principles of the [place], discuss the value of eating mindfully and how to measure internal cues of hunger and fullness using the  Hunger Scale. Patients will also discuss reasons for non-hunger eating and learn strategies to use for controlling emotional eating.  Targeting Your Nutrition Priorities Clinical staff led group instruction and group discussion with PowerPoint presentation and patient guidebook. To enhance the learning environment the use of posters, models and videos may be added. Patients will learn how to determine their genetic susceptibility to disease by reviewing their family history. Patients will gain insight into the importance of diet as part of an overall healthy lifestyle in mitigating the impact of genetics and other environmental insults. The purpose of this lesson is to provide patients with the opportunity to assess their personal nutrition priorities by looking at their family history, their own health history and current risk factors. Patients will also be able to discuss ways of prioritizing and modifying the [place] for their highest risk areas  Menu  Clinical staff led group instruction and group discussion with PowerPoint presentation and patient guidebook. To enhance the learning environment the use of posters, models and videos may be added. Using menus brought in from [employer], or printed from [employer], patients will apply the [place] dining out guidelines that were presented in the [employer] video. Patients will also be able to practice these guidelines in a variety of  provided scenarios. The purpose of this lesson is to provide patients with the opportunity to practice hands-on learning of the [place] with actual menus and practice scenarios.  Label Reading Clinical staff led group instruction and group discussion with PowerPoint presentation and patient guidebook. To enhance the learning environment the use of posters, models and videos may be added. Patients will review and discuss the Pritikin label reading guidelines  presented in Pritikin's Label Reading Educational series video. Using fool labels brought in from local grocery stores and markets, patients will apply the label reading guidelines and determine if the packaged food meet the Pritikin guidelines. The purpose of this lesson is to provide patients with the opportunity to review, discuss, and practice hands-on learning of the Pritikin Label Reading guidelines with actual packaged food labels. [facility]hops are designed to teach patients ways to prepare quick, simple, and affordable recipes at home. The importance of nutrition's role in chronic disease risk reduction is reflected in its emphasis in the overall [facility] program. By learning how to prepare essential core [facility] Eating Plan recipes, patients will increase control over what they eat; be able to customize the flavor of foods without the use of added salt, sugar, or fat; and improve the quality of the food they consume. By learning a set of core recipes which are easily assembled, quickly prepared, and affordable, patients are more likely to prepare more healthy foods at home. These workshops focus on convenient breakfasts, simple entres, side dishes, and desserts which can be prepared with minimal effort and are consistent with nutrition recommendations for cardiovascular risk reduction. Cooking [employer] are taught by a Engineer, materials (RD) who has been trained by the [employer]. The chef or RD has a clear understanding of the importance of minimizing - if not completely eliminating - added fat, sugar, and sodium in recipes. Throughout the series of [place] Workshop sessions, patients will learn about healthy ingredients and efficient methods of cooking to build confidence in their capability to prepare    Cooking School weekly topics:  Adding Flavor- Sodium-Free  Fast and Healthy Breakfasts  Powerhouse Plant-Based  Proteins  Satisfying Salads and Dressings  Simple Sides and Sauces  International Cuisine-Spotlight on the [place] Zones  Delicious Desserts  Savory Soups  Efficiency Cooking - Meals in a Snap  Tasty Appetizers and Snacks  Comforting Weekend Breakfasts  One-Pot Wonders   Fast Evening Meals  Easy [place] (Psychosocial): New Thoughts, New Behaviors Clinical staff led group instruction and group discussion with PowerPoint presentation and patient guidebook. To enhance the learning environment the use of posters, models and videos may be added. Patients will learn and practice techniques for developing effective health and lifestyle goals. Patients will be able to effectively apply the goal setting process learned to develop at least one new personal goal.  The purpose of this lesson is to expose patients to a new skill set of behavior modification techniques such as techniques setting SMART goals, overcoming barriers, and achieving new thoughts and new behaviors.  Managing Moods and Relationships Clinical staff led group instruction and group discussion with PowerPoint presentation and patient guidebook. To enhance the learning environment the use of posters, models and videos may be added. Patients will learn how emotional and chronic stress factors can impact their health and relationships. They will learn healthy ways to manage their moods and utilize  positive coping mechanisms. In addition, ICR patients will learn ways to improve communication skills. The purpose of this lesson is to expose patients to ways of understanding how one's mood and health are intimately connected. Developing a healthy outlook can help build positive relationships and connections with others. Patients will understand the importance of utilizing effective communication skills that include actively listening and being heard. They will learn and  understand the importance of the "4 Cs" and especially Connections in fostering of a Healthy Mind-Set.  Healthy Sleep for a Healthy Heart Clinical staff led group instruction and group discussion with PowerPoint presentation and patient guidebook. To enhance the learning environment the use of posters, models and videos may be added. At the conclusion of this workshop, patients will be able to demonstrate knowledge of the importance of sleep to overall health, well-being, and quality of life. They will understand the symptoms of, and treatments for, common sleep disorders. Patients will also be able to identify daytime and nighttime behaviors which impact sleep, and they will be able to apply these tools to help manage sleep-related challenges. The purpose of this lesson is to provide patients with a general overview of sleep and outline the importance of quality sleep. Patients will learn about a few of the most common sleep disorders. Patients will also be introduced to the concept of "sleep hygiene," and discover ways to self-manage certain sleeping problems through simple daily behavior changes. Finally, the workshop will motivate patients by clarifying the links between quality sleep and their goals of heart-healthy living.   Recognizing and Reducing Stress Clinical staff led group instruction and group discussion with PowerPoint presentation and patient guidebook. To enhance the learning environment the use of posters, models and videos may be added. At the conclusion of this workshop, patients will be able to understand the types of stress reactions, differentiate between acute and chronic stress, and recognize the impact that chronic stress has on their health. They will also be able to apply different coping mechanisms, such as reframing negative self-talk. Patients will have the opportunity to practice a variety of stress management techniques, such as deep abdominal breathing, progressive muscle  relaxation, and/or guided imagery.  The purpose of this lesson is to educate patients on the role of stress in their lives and to provide healthy techniques for coping with it.  Learning Barriers/Preferences:  Learning Barriers/Preferences - 09/21/22 1400       Learning Barriers/Preferences   Learning Barriers Exercise Concerns   Barnabas Lister says that he has some dizziness from taking beta blocker which was recently added   Learning Preferences Verbal Instruction;Pictoral;Audio;Video             Education Topics:  Knowledge Questionnaire Score:  Knowledge Questionnaire Score - 09/21/22 1322       Knowledge Questionnaire Score   Pre Score 20/24             Core Components/Risk Factors/Patient Goals at Admission:  Personal Goals and Risk Factors at Admission - 09/21/22 1322       Core Components/Risk Factors/Patient Goals on Admission    Weight Management Yes;Weight Loss    Intervention Weight Management: Provide education and appropriate resources to help participant work on and attain dietary goals.;Weight Management: Develop a combined nutrition and exercise program designed to reach desired caloric intake, while maintaining appropriate intake of nutrient and fiber, sodium and fats, and appropriate energy expenditure required for the weight goal.    Admit Weight 208 lb 15.9 oz (94.8 kg)  Goal Weight: Short Term 200 lb (90.7 kg)    Goal Weight: Long Term 195 lb (88.5 kg)    Expected Outcomes Short Term: Continue to assess and modify interventions until short term weight is achieved;Long Term: Adherence to nutrition and physical activity/exercise program aimed toward attainment of established weight goal;Weight Loss: Understanding of general recommendations for a balanced deficit meal plan, which promotes 1-2 lb weight loss per week and includes a negative energy balance of [phone] kcal/d    Hypertension Yes    Intervention Provide education on lifestyle modifcations including  regular physical activity/exercise, weight management, moderate sodium restriction and increased consumption of fresh fruit, vegetables, and low fat dairy, alcohol moderation, and smoking cessation.;Monitor prescription use compliance.    Expected Outcomes Short Term: Continued assessment and intervention until BP is < 140/47m HG in hypertensive participants. < 130/833mHG in hypertensive participants with diabetes, heart failure or chronic kidney disease.;Long Term: Maintenance of blood pressure at goal levels.    Lipids Yes    Intervention Provide education and support for participant on nutrition & aerobic/resistive exercise along with prescribed medications to achieve LDL '70mg'$ , HDL >'40mg'$ .    Expected Outcomes Short Term: Participant states understanding of desired cholesterol values and is compliant with medications prescribed. Participant is following exercise prescription and nutrition guidelines.;Long Term: Cholesterol controlled with medications as prescribed, with individualized exercise RX and with personalized nutrition plan. Value goals: LDL < '70mg'$ , HDL > 40 mg.             Core Components/Risk Factors/Patient Goals Review:   Goals and Risk Factor Review     Row Name 09/27/22 1408 10/12/22 0825           Core Components/Risk Factors/Patient Goals Review   Personal Goals Review Weight Management/Obesity;Hypertension;Lipids Weight Management/Obesity;Hypertension;Lipids      Review JaBarnabas Listertarted intensive cardiac rehab on 09/27/22 and did well with exercise. Vital signs were stable JaBarnabas Listeras been doing  well with exercise at intensive cardiac rehab. Vital signs have stable. JaBarnabas Listeras lost 1.8 kg since starting intensive cardiac rehab      Expected Outcomes JaBarnabas Listerill have dcreased stress upon completion on intensive cardiac rehab JaBarnabas Listerill continue to participate in intensive cardiac rehab for exercise, nutrition and lifestyle modifications               Core Components/Risk  Factors/Patient Goals at Discharge (Final Review):   Goals and Risk Factor Review - 10/12/22 0825       Core Components/Risk Factors/Patient Goals Review   Personal Goals Review Weight Management/Obesity;Hypertension;Lipids    Review JaBarnabas Listeras been doing  well with exercise at intensive cardiac rehab. Vital signs have stable. JaBarnabas Listeras lost 1.8 kg since starting intensive cardiac rehab    Expected Outcomes JaBarnabas Listerill continue to participate in intensive cardiac rehab for exercise, nutrition and lifestyle modifications             ITP Comments:  ITP Comments     Row Name 09/21/22 0953 09/27/22 1403 10/12/22 [id number]     ITP Comments Medical Director- Dr. TrFransico HimMD.  Introduction to [facility] Education Program/Intensive Cardiac rehab. Initial Orientation packet reviewed with the patient. 30 Day ITP Review. JaBarnabas Listertarted intensive cardiac rehab on 09/27/22 and did well with exercise 30 Day ITP Review. JaBarnabas Listeras good attendance and participation in intensive cardiac rehab and is off to a good start with exercise              Comments:  See ITP Comments

## 2022-10-13 ENCOUNTER — Encounter (HOSPITAL_COMMUNITY)
Admission: RE | Admit: 2022-10-13 | Discharge: 2022-10-13 | Disposition: A | Discharge status: Home / Self Care | Payer: PPO | Source: Ambulatory Visit | Attending: Internal Medicine | Admitting: Internal Medicine

## 2022-10-13 DIAGNOSIS — Z48812 Encounter for surgical aftercare following surgery on the circulatory system: Secondary | ICD-10-CM | POA: Insufficient documentation

## 2022-10-13 DIAGNOSIS — Z955 Presence of coronary angioplasty implant and graft: Secondary | ICD-10-CM | POA: Insufficient documentation

## 2022-10-13 NOTE — Progress Notes (Signed)
Reviewed home exercise guidelines with patient including endpoints, temperature precautions, target heart rate and rate of perceived exertion. Patient is currently walking 1 mile twice/day walking his dogs as his mode of home exercise. Patient is also a member at the [employer] and [employer] where he also plans to exercise as well. Patient voices understanding of instructions given.  Sol Passer, MS, ACSM CEP

## 2022-10-15 ENCOUNTER — Encounter (HOSPITAL_COMMUNITY)
Admission: RE | Admit: 2022-10-15 | Discharge: 2022-10-15 | Disposition: A | Discharge status: Home / Self Care | Payer: PPO | Source: Ambulatory Visit | Attending: Internal Medicine | Admitting: Internal Medicine

## 2022-10-15 DIAGNOSIS — Z955 Presence of coronary angioplasty implant and graft: Secondary | ICD-10-CM

## 2022-10-15 DIAGNOSIS — Z48812 Encounter for surgical aftercare following surgery on the circulatory system: Secondary | ICD-10-CM | POA: Diagnosis not present

## 2022-10-18 ENCOUNTER — Encounter (HOSPITAL_COMMUNITY)
Admission: RE | Admit: 2022-10-18 | Discharge: 2022-10-18 | Disposition: A | Discharge status: Home / Self Care | Payer: PPO | Source: Ambulatory Visit | Attending: Internal Medicine | Admitting: Internal Medicine

## 2022-10-18 DIAGNOSIS — Z48812 Encounter for surgical aftercare following surgery on the circulatory system: Secondary | ICD-10-CM | POA: Diagnosis not present

## 2022-10-18 DIAGNOSIS — Z955 Presence of coronary angioplasty implant and graft: Secondary | ICD-10-CM

## 2022-10-20 ENCOUNTER — Encounter (HOSPITAL_COMMUNITY)
Admission: RE | Admit: 2022-10-20 | Discharge: 2022-10-20 | Disposition: A | Discharge status: Home / Self Care | Payer: PPO | Source: Ambulatory Visit | Attending: Internal Medicine | Admitting: Internal Medicine

## 2022-10-20 DIAGNOSIS — Z955 Presence of coronary angioplasty implant and graft: Secondary | ICD-10-CM

## 2022-10-20 DIAGNOSIS — Z48812 Encounter for surgical aftercare following surgery on the circulatory system: Secondary | ICD-10-CM | POA: Diagnosis not present

## 2022-10-22 ENCOUNTER — Encounter (HOSPITAL_COMMUNITY)
Admission: RE | Admit: 2022-10-22 | Discharge: 2022-10-22 | Disposition: A | Discharge status: Home / Self Care | Payer: PPO | Source: Ambulatory Visit | Attending: Internal Medicine | Admitting: Internal Medicine

## 2022-10-22 DIAGNOSIS — Z955 Presence of coronary angioplasty implant and graft: Secondary | ICD-10-CM

## 2022-10-22 DIAGNOSIS — Z48812 Encounter for surgical aftercare following surgery on the circulatory system: Secondary | ICD-10-CM | POA: Diagnosis not present

## 2022-10-25 ENCOUNTER — Encounter (HOSPITAL_COMMUNITY)
Admission: RE | Admit: 2022-10-25 | Discharge: 2022-10-25 | Disposition: A | Discharge status: Home / Self Care | Payer: PPO | Source: Ambulatory Visit | Attending: Internal Medicine | Admitting: Internal Medicine

## 2022-10-25 DIAGNOSIS — Z48812 Encounter for surgical aftercare following surgery on the circulatory system: Secondary | ICD-10-CM | POA: Diagnosis not present

## 2022-10-25 DIAGNOSIS — Z955 Presence of coronary angioplasty implant and graft: Secondary | ICD-10-CM

## 2022-10-27 ENCOUNTER — Encounter (HOSPITAL_COMMUNITY)
Admission: RE | Admit: 2022-10-27 | Discharge: 2022-10-27 | Disposition: A | Discharge status: Home / Self Care | Payer: PPO | Source: Ambulatory Visit | Attending: Internal Medicine | Admitting: Internal Medicine

## 2022-10-27 DIAGNOSIS — Z955 Presence of coronary angioplasty implant and graft: Secondary | ICD-10-CM

## 2022-10-27 DIAGNOSIS — Z48812 Encounter for surgical aftercare following surgery on the circulatory system: Secondary | ICD-10-CM | POA: Diagnosis not present

## 2022-10-29 ENCOUNTER — Encounter (HOSPITAL_COMMUNITY)
Admission: RE | Admit: 2022-10-29 | Discharge: 2022-10-29 | Disposition: A | Discharge status: Home / Self Care | Payer: PPO | Source: Ambulatory Visit | Attending: Internal Medicine | Admitting: Internal Medicine

## 2022-10-29 DIAGNOSIS — Z48812 Encounter for surgical aftercare following surgery on the circulatory system: Secondary | ICD-10-CM | POA: Diagnosis not present

## 2022-10-29 DIAGNOSIS — Z955 Presence of coronary angioplasty implant and graft: Secondary | ICD-10-CM

## 2022-11-01 ENCOUNTER — Encounter (HOSPITAL_COMMUNITY)
Admission: RE | Admit: 2022-11-01 | Discharge: 2022-11-01 | Disposition: A | Discharge status: Home / Self Care | Payer: PPO | Source: Ambulatory Visit | Attending: Internal Medicine | Admitting: Internal Medicine

## 2022-11-01 DIAGNOSIS — Z955 Presence of coronary angioplasty implant and graft: Secondary | ICD-10-CM

## 2022-11-01 DIAGNOSIS — Z48812 Encounter for surgical aftercare following surgery on the circulatory system: Secondary | ICD-10-CM | POA: Diagnosis not present

## 2022-11-03 ENCOUNTER — Encounter (HOSPITAL_COMMUNITY)
Admission: RE | Admit: 2022-11-03 | Discharge: 2022-11-03 | Disposition: A | Discharge status: Home / Self Care | Payer: PPO | Source: Ambulatory Visit | Attending: Internal Medicine | Admitting: Internal Medicine

## 2022-11-03 DIAGNOSIS — Z955 Presence of coronary angioplasty implant and graft: Secondary | ICD-10-CM

## 2022-11-03 DIAGNOSIS — Z48812 Encounter for surgical aftercare following surgery on the circulatory system: Secondary | ICD-10-CM | POA: Diagnosis not present

## 2022-11-05 ENCOUNTER — Encounter (HOSPITAL_COMMUNITY)
Admission: RE | Admit: 2022-11-05 | Discharge: 2022-11-05 | Disposition: A | Discharge status: Home / Self Care | Payer: PPO | Source: Ambulatory Visit | Attending: Internal Medicine | Admitting: Internal Medicine

## 2022-11-05 DIAGNOSIS — Z955 Presence of coronary angioplasty implant and graft: Secondary | ICD-10-CM

## 2022-11-05 DIAGNOSIS — Z48812 Encounter for surgical aftercare following surgery on the circulatory system: Secondary | ICD-10-CM | POA: Diagnosis not present

## 2022-11-08 ENCOUNTER — Encounter (HOSPITAL_COMMUNITY)
Admission: RE | Admit: 2022-11-08 | Discharge: 2022-11-08 | Disposition: A | Discharge status: Home / Self Care | Payer: PPO | Source: Ambulatory Visit | Attending: Internal Medicine | Admitting: Internal Medicine

## 2022-11-08 DIAGNOSIS — Z955 Presence of coronary angioplasty implant and graft: Secondary | ICD-10-CM

## 2022-11-08 DIAGNOSIS — Z48812 Encounter for surgical aftercare following surgery on the circulatory system: Secondary | ICD-10-CM | POA: Diagnosis not present

## 2022-11-09 NOTE — Progress Notes (Signed)
Cardiac Individual Treatment Plan  Patient Details  Name: Patrick Noble. MRN: [id number] Date of Birth: [birth year] Referring Provider:   Flowsheet Row INTENSIVE CARDIAC REHAB ORIENT from 09/21/2022 in [facility]  Referring Provider Pixie Casino, MD       Initial Encounter Date:  [place] from 09/21/2022 in [facility]  Date 09/21/22       Visit Diagnosis: 07/01/22 DES pLAD/OM2  Patient's Home Medications on Admission:  Current Outpatient Medications:    acetaminophen (TYLENOL) 500 MG tablet, Take 1,000 mg by mouth every 6 (six) hours as needed for moderate pain., Disp: , Rfl:    aspirin EC 81 MG tablet, Take 1 tablet (81 mg total) by mouth daily. Swallow whole., Disp: 90 tablet, Rfl: 3   atorvastatin (LIPITOR) 80 MG tablet, Take 1 tablet (80 mg total) by mouth daily., Disp: 90 tablet, Rfl: 3   Cholecalciferol (DIALYVITE VITAMIN D 5000) 125 MCG (5000 UT) capsule, Take 5,000 Units by mouth daily., Disp: , Rfl:    clonazePAM (KLONOPIN) 0.5 MG tablet, Take 0.25 mg by mouth at bedtime., Disp: , Rfl:    clopidogrel (PLAVIX) 75 MG tablet, Take 1 tablet (75 mg total) by mouth daily., Disp: 90 tablet, Rfl: 3   fluticasone (FLONASE) 50 MCG/ACT nasal spray, Place 1 spray into both nostrils daily as needed for allergies., Disp: , Rfl:    HYDROcodone-acetaminophen (NORCO/VICODIN) 5-325 MG tablet, Take 0.5-1 tablets by mouth every 6 (six) hours as needed for moderate pain. , Disp: , Rfl:    melatonin 5 MG TABS, Take 5 mg by mouth at bedtime., Disp: , Rfl:    metoprolol tartrate (LOPRESSOR) 25 MG tablet, Take 0.5 tablets (12.5 mg total) by mouth 2 (two) times daily., Disp: 90 tablet, Rfl: 3   Multiple Vitamins-Minerals (MULTIVITAMIN WITH MINERALS) tablet, Take 1 tablet by mouth daily., Disp: , Rfl:    Multiple Vitamins-Minerals (PRESERVISION  AREDS 2 PO), Take 1 capsule by mouth 2 (two) times daily., Disp: , Rfl:    nitroGLYCERIN (NITROSTAT) 0.4 MG SL tablet, Place 1 tablet (0.4 mg total) under the tongue every 5 (five) minutes as needed for chest pain., Disp: 30 tablet, Rfl: 2   PARoxetine (PAXIL) 40 MG tablet, Take 40 mg by mouth at bedtime. , Disp: , Rfl:    Vitamin D, Ergocalciferol, (DRISDOL) 1.25 MG (50000 UNIT) CAPS capsule, Take 1 capsule (50,000 Units total) by mouth every 7 (seven) days. Take for 4 weeks then start OTC vitamin D 1000 IU daily (Patient not taking: Reported on 09/20/2022), Disp: 4 capsule, Rfl: 0   Wheat Dextrin (BENEFIBER DRINK MIX PO), Take 1 Scoop by mouth daily., Disp: , Rfl:   Past Medical History: Past Medical History:  Diagnosis Date   Coronary artery disease    Degenerative arthritis of lumbar spine    Hyperlipidemia    Hypertension    Kidney stone    Prostate pain    Scoliosis     Tobacco Use: Social History   Tobacco Use  Smoking Status Former  Smokeless Tobacco Never    Labs: Review Flowsheet        No data to display          Capillary Blood Glucose: No results found for: "GLUCAP"   Exercise Target Goals: Exercise Program Goal: Individual exercise prescription set using results from initial 6 min walk test  and THRR while considering  patient's activity barriers and safety.   Exercise Prescription Goal: Initial exercise prescription builds to 30-45 minutes a day of aerobic activity, 2-3 days per week.  Home exercise guidelines will be given to patient during program as part of exercise prescription that the participant will acknowledge.  Activity Barriers & Risk Stratification:  Activity Barriers & Cardiac Risk Stratification - 09/21/22 1100       Activity Barriers & Cardiac Risk Stratification   Activity Barriers Other (comment);Back Problems    Comments Chronic bilateral knee pain, scoliosis of the thoracic region, chronic low back pain, L5-S1 degenerative disc  disease.    Cardiac Risk Stratification High             6 Minute Walk:  6 Minute Walk     Row Name 09/21/22 1110         6 Minute Walk   Phase Initial     Distance 1526 feet     Walk Time 6 minutes     # of Rest Breaks 0     MPH 2.89     METS 3.9     RPE 11     Perceived Dyspnea  1     VO2 Peak 13.66     Symptoms Yes (comment)     Comments Mild shortness of breath, RPD=1; mild back pain, chronic= 1/10 on the pain scale. Symptoms resolved with rest.     Resting HR 81 bpm     Resting BP 98/64     Resting Oxygen Saturation  98 %     Exercise Oxygen Saturation  during 6 min walk 97 %     Max Ex. HR 103 bpm     Max Ex. BP 158/70     2 Minute Post BP 112/68              Oxygen Initial Assessment:   Oxygen Re-Evaluation:   Oxygen Discharge (Final Oxygen Re-Evaluation):   Initial Exercise Prescription:  Initial Exercise Prescription - 09/21/22 1300       Date of Initial Exercise RX and Referring Provider   Date 09/21/22    Referring Provider Pixie Casino, MD    Expected Discharge Date 11/19/21      Recumbant Bike   Level --    Watts --    Minutes --    METs --      NuStep   Level 2    SPM 85    Minutes 15    METs 2.5      Recumbant Elliptical   Level 1    Watts 25    Minutes 15    METs 2.8      Prescription Details   Frequency (times per week) 3    Duration Progress to 30 minutes of continuous aerobic without signs/symptoms of physical distress      Intensity   THRR 40-80% of Max Heartrate 60-121    Ratings of Perceived Exertion 11-13    Perceived Dyspnea 0-4      Progression   Progression Continue to progress workloads to maintain intensity without signs/symptoms of physical distress.      Resistance Training   Training Prescription Yes    Weight 4 lbs    Reps 10-15             Perform Capillary Blood Glucose checks as needed.  Exercise Prescription Changes:   Exercise Prescription Changes     Row Name 09/27/22  [phone] 10/08/22  1009 11/05/22 1013         Response to Exercise   Blood Pressure (Admit) 116/62 110/70 108/60     Blood Pressure (Exercise) 148/72 142/78 128/72     Blood Pressure (Exit) 110/66 121/75 112/60     Heart Rate (Admit) 73 bpm 68 bpm 68 bpm     Heart Rate (Exercise) 89 bpm 98 bpm 105 bpm     Heart Rate (Exit) 75 bpm 81 bpm 81 bpm     Rating of Perceived Exertion (Exercise) '10 12 10     '$ Symptoms None None None     Comments Off to a great start with exercise. -- --     Duration Continue with 30 min of aerobic exercise without signs/symptoms of physical distress. Continue with 30 min of aerobic exercise without signs/symptoms of physical distress. Continue with 30 min of aerobic exercise without signs/symptoms of physical distress.     Intensity THRR unchanged THRR unchanged THRR unchanged       Progression   Progression Continue to progress workloads to maintain intensity without signs/symptoms of physical distress. Continue to progress workloads to maintain intensity without signs/symptoms of physical distress. Continue to progress workloads to maintain intensity without signs/symptoms of physical distress.     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     Frequency -- -- Add 4 additional days to program exercise sessions.     Initial Home Exercises Provided -- -- 10/13/22              Exercise Comments:   Exercise Comments      Row Name 09/27/22 1302 09/29/22 1035 10/13/22 1041 11/05/22 1111     Exercise Comments Patient tolerated low intensity exercise well, with some low back pain, which is chronic for him. Reviewed METs with patient. Reviewed home exercise guidelines, METs, and goals with patient. Reviewed METs and goals with patient.             Exercise Goals and Review:   Exercise Goals     Row Name 09/21/22 1100             Exercise Goals   Increase Physical Activity Yes       Intervention Provide advice, education, support and counseling about physical activity/exercise needs.;Develop an individualized exercise prescription for aerobic and resistive training based on initial evaluation findings, risk stratification, comorbidities and participant's personal goals.       Expected Outcomes Short Term: Attend rehab on a regular basis to  increase amount of physical activity.;Long Term: Exercising regularly at least 3-5 days a week.       Increase Strength and Stamina Yes       Intervention Provide advice, education, support and counseling about physical activity/exercise needs.;Develop an individualized exercise prescription for aerobic and resistive training based on initial evaluation findings, risk stratification, comorbidities and participant's personal goals.       Expected Outcomes Short Term: Increase workloads from initial exercise prescription for resistance, speed, and METs.;Short Term: Perform resistance training exercises routinely during rehab and add in resistance training at home;Long Term: Improve cardiorespiratory fitness, muscular endurance and strength as measured by increased METs and functional capacity (6MWT)       Able to understand and use rate of perceived exertion (RPE) scale Yes       Intervention Provide education and explanation on how to use RPE scale       Expected Outcomes Short Term: Able to use RPE daily in rehab to express subjective intensity level;Long Term:  Able to  use RPE to guide intensity level when exercising independently       Knowledge and understanding of Target Heart Rate Range (THRR) Yes       Intervention Provide education and explanation of THRR including how the numbers were predicted and where they are located for reference       Expected Outcomes Short Term: Able to state/look up THRR;Long Term: Able to use THRR to govern intensity when exercising independently;Short Term: Able to use daily as guideline for intensity in rehab       Able to check pulse independently Yes       Intervention Provide education and demonstration on how to check pulse in carotid and radial arteries.;Review the importance of being able to check your own pulse for safety during independent exercise       Expected Outcomes Short Term: Able to explain why pulse checking is important during independent exercise;Long Term: Able to check pulse independently and accurately       Understanding of Exercise Prescription Yes       Intervention Provide education, explanation, and written materials on patient's individual exercise prescription       Expected Outcomes Short Term: Able to explain program exercise prescription;Long Term: Able to explain home exercise prescription to exercise independently                Exercise Goals Re-Evaluation :  Exercise Goals Re-Evaluation     Row Name 09/27/22 1302 10/13/22 1041 11/05/22 1111         Exercise Goal Re-Evaluation   Exercise Goals Review Increase Physical Activity;Able to understand and use rate of perceived exertion (RPE) scale Increase Physical Activity;Able to understand and use rate of perceived exertion (RPE) scale;Understanding of Exercise Prescription;Increase Strength and Stamina;Knowledge and understanding of Target Heart Rate Range (THRR) Increase Physical Activity;Able to understand and use rate of perceived exertion (RPE) scale;Understanding of Exercise Prescription;Increase Strength and Stamina;Knowledge and  understanding of Target Heart Rate Range (THRR)     Comments Patient able to understand and use RPE scale appropriately. Reviewed exercise prescription with patient. Patient is walking daily and plans to exercise at fitness center. Patient is making great progress with exercise. Patient's goal is to increase stamina and to have less back pain. Patient walks daily at home and has gym access through [employer]. Patient recently tried the upright elliptical at the gym and found it was bothered his knees. Patient will look for more recumbent  machines at the gym and also has access to his daughter's recumbent bike, which he also might try. Patient is using resistance bands at home and may also start using the weight machines at the gym with low weight and higher reps (10-15) as tolerated. Patient has some stretches from PT for his back that he is also doing. Patient plans to continue exercise at fitness center upon completion of the program.     Expected Outcomes Progress workloads as tolerated to increase cardiorespiratory fitness. Patient will continue daily exercise routine walking and will incorporate exercise at fitness center. Patient will continue daily exercise routine walking and will incorporate exercise at fitness center.              Discharge Exercise Prescription (Final Exercise Prescription Changes):  Exercise Prescription Changes - 11/05/22 1013       Response to Exercise   Blood Pressure (Admit) 108/60    Blood Pressure (Exercise) 128/72    Blood Pressure (Exit) 112/60    Heart Rate (Admit) 68 bpm    Heart Rate (Exercise) 105 bpm    Heart Rate (Exit) 81 bpm    Rating of Perceived Exertion (Exercise) 10    Symptoms None    Duration Continue with 30 min of aerobic exercise without signs/symptoms of physical distress.    Intensity THRR unchanged      Progression   Progression Continue to progress workloads to maintain intensity without signs/symptoms of physical distress.     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   Frequency Add 4 additional days to program exercise sessions.    Initial Home Exercises Provided 10/13/22             Nutrition:  Target Goals: Understanding of nutrition guidelines, daily intake of sodium '1500mg'$ , cholesterol '200mg'$ , calories 30% from fat and 7% or less from saturated fats, daily to have 5 or more servings of fruits and vegetables.  Biometrics:  Pre Biometrics - 09/21/22 0953       Pre Biometrics   Waist Circumference 41.75 inches    Hip Circumference 42 inches    Waist to Hip Ratio 0.99 %    Triceps Skinfold 11 mm    % Body Fat 25.5 %    Grip Strength 52 kg    Flexibility --   Not performed, chronic back pain.   Single Leg Stand 14.37 seconds              Nutrition Therapy Plan and Nutrition Goals:  Nutrition Therapy & Goals - 10/27/22 1408       Nutrition Therapy   Diet Heart Healthy Diet    Drug/Food Interactions Statins/Certain Fruits      Personal Nutrition Goals   Nutrition Goal Patient to identify strategies for reducing cardiovascular risk by  attending the [facility] education and nutrition series    Personal Goal #2 Patient to improve diet quality by using the plate method as a guide for meal planning to including lean protein/plant protein, fruits, vegetables, whole gains and nonfat dairy.    Personal Goal #3 Patient to identify food sources of saturated fat, trans fat, sodium, and refined carbohydrates    Personal Goal #4 Patient to identify strategies for weight loss of 0.5-2.0# per  week    Comments Goals in action. Barnabas Lister continues to attend the [facility] education series. He has made many dietary changes including increased dietary fiber, reduced saturated fat, and continues to read food labels for sodium and sugar. Barnabas Lister will continue to benefit from participation in intensive cardiac rehab for nutrition, exericse, and lifestyle modification.      Intervention Plan   Intervention Prescribe, educate and counsel regarding individualized specific dietary modifications aiming towards targeted core components such as weight, hypertension, lipid management, diabetes, heart failure and other comorbidities.;Nutrition handout(s) given to patient.    Expected Outcomes Short Term Goal: Understand basic principles of dietary content, such as calories, fat, sodium, cholesterol and nutrients.;Long Term Goal: Adherence to prescribed nutrition plan.             Nutrition Assessments:  Nutrition Assessments - 09/27/22 1128       Rate Your Plate Scores   Pre Score 65            MEDIFICTS Score Key: ?70 Need to make dietary changes  40-70 Heart Healthy Diet ? 40 Therapeutic Level Cholesterol Diet   Flowsheet Row INTENSIVE CARDIAC REHAB from 09/27/2022 in [facility]  Picture Your Plate Total Score on Admission 65      Picture Your Plate Scores: <36 Unhealthy dietary pattern with much room for improvement. 41-50 Dietary pattern unlikely to meet recommendations for good health and room for improvement. 51-60 More healthful dietary pattern, with some room for improvement.  >60 Healthy dietary pattern, although there may be some specific behaviors that could be improved.    Nutrition Goals Re-Evaluation:  Nutrition Goals Re-Evaluation     [place] Name 09/27/22 1543 10/27/22 1408           Goals   Current Weight 206 lb 12.7 oz (93.8 kg) 201 lb 11.5 oz (91.5 kg)      Comment Per cardiology notes, most recent labs  from October 2022 include cholesterol 178, triglycerides 172, HDL 32, LDL 115, A1c 5.8 No new labs; A1c 6.3 in 07/2022.      Expected Outcome Patient reports eating a wide variety of high fiber foods and mindfulness of sodium and sugar. He reports eagerness to learn more about heart healthy nutrition. His wife and daughter remain a good support toward lifestyle changes. He is motivated to lose weight with goal weight of 195#. Mr. Bille will benefit from attending [facility] ICR and adherance to the [facility] eating plan to support improved lipid panel with LDL <70, HDL >40, improved diet quality for blood sugar support and weight loss. Goals in action. Barnabas Lister continues to attend the [facility] education series. He has made many dietary changes including increased dietary fiber, reduced saturated fat, and continues to read food labels for sodium and sugar. Barnabas Lister is down ~7# since starting with our program. Barnabas Lister will continue to benefit from participation in intensive cardiac rehab for nutrition, exericse, and lifestyle modification.  Nutrition Goals Re-Evaluation:  Nutrition Goals Re-Evaluation     [place] Name 09/27/22 1543 10/27/22 1408           Goals   Current Weight 206 lb 12.7 oz (93.8 kg) 201 lb 11.5 oz (91.5 kg)      Comment Per cardiology notes, most recent labs from October 2022 include cholesterol 178, triglycerides 172, HDL 32, LDL 115, A1c 5.8 No new labs; A1c 6.3 in 07/2022.      Expected Outcome Patient reports eating a wide variety of high fiber foods and mindfulness of sodium and sugar. He reports eagerness to learn more about heart healthy nutrition. His wife and daughter remain a good support toward lifestyle changes. He is motivated to lose weight with goal weight of 195#. Mr. Fawver will benefit from attending [facility] ICR and adherance to the [facility] eating plan to support improved lipid panel with LDL <70, HDL >40, improved diet quality for blood sugar support and weight loss.  Goals in action. Barnabas Lister continues to attend the [facility] education series. He has made many dietary changes including increased dietary fiber, reduced saturated fat, and continues to read food labels for sodium and sugar. Barnabas Lister is down ~7# since starting with our program. Barnabas Lister will continue to benefit from participation in intensive cardiac rehab for nutrition, exericse, and lifestyle modification.               Nutrition Goals Discharge (Final Nutrition Goals Re-Evaluation):  Nutrition Goals Re-Evaluation - 10/27/22 1408       Goals   Current Weight 201 lb 11.5 oz (91.5 kg)    Comment No new labs; A1c 6.3 in 07/2022.    Expected Outcome Goals in action. Barnabas Lister continues to attend the [facility] education series. He has made many dietary changes including increased dietary fiber, reduced saturated fat, and continues to read food labels for sodium and sugar. Barnabas Lister is down ~7# since starting with our program. Barnabas Lister will continue to benefit from participation in intensive cardiac rehab for nutrition, exericse, and lifestyle modification.             Psychosocial: Target Goals: Acknowledge presence or absence of significant depression and/or stress, maximize coping skills, provide positive support system. Participant is able to verbalize types and ability to use techniques and skills needed for reducing stress and depression.  Initial Review & Psychosocial Screening:  Initial Psych Review & Screening - 09/21/22 1359       Initial Review   Current issues with Current Anxiety/Panic;History of Depression      Family Dynamics   Good Support System? Yes   Barnabas Lister has his wife and daughter for support   Comments Barnabas Lister is currently taking an antidepressant. Barnabas Lister denies being depressed at this time.      Barriers   Psychosocial barriers to participate in program There are no identifiable barriers or psychosocial needs.      Screening Interventions   Interventions Encouraged to exercise              Quality of Life Scores:  Quality of Life - 09/21/22 1321       Quality of Life   Select Quality of Life      Quality of Life Scores   Health/Function Pre 25.47 %    Socioeconomic Pre 25.79 %    Psych/Spiritual Pre 24.71 %    Family Pre 27.6 %    GLOBAL Pre 25.69 %            Scores of  19 and below usually indicate a poorer quality of life in these areas.  A difference of  2-3 points is a clinically meaningful difference.  A difference of 2-3 points in the total score of the Quality of Life Index has been associated with significant improvement in overall quality of life, self-image, physical symptoms, and general health in studies assessing change in quality of life.  PHQ-9: Review Flowsheet       09/21/2022  Depression screen PHQ 2/9  Decreased Interest 0  Down, Depressed, Hopeless 0  PHQ - 2 Score 0   Interpretation of Total Score  Total Score Depression Severity:  1-4 = Minimal depression, 5-9 = Mild depression, 10-14 = Moderate depression, 15-19 = Moderately severe depression, 20-27 = Severe depression   Psychosocial Evaluation and Intervention:   Psychosocial Re-Evaluation:  Psychosocial Re-Evaluation     [place] Name 09/27/22 1405 10/12/22 0824 11/09/22 1518         Psychosocial Re-Evaluation   Current issues with Current Anxiety/Panic;History of Depression Current Anxiety/Panic;History of Depression Current Anxiety/Panic;History of Depression     Comments Barnabas Lister did not voice any concerns on his first day of exercise at intensive cardiac rehab Barnabas Lister continues not to voce any concerns or stressors  at intensive cardiac rehab Barnabas Lister continues not to voce any concerns or stressors  at intensive cardiac rehab. Barnabas Lister will complete intensive cardiac rehab on 11/19/22     Expected Outcomes Johngabriel will have controlled or decreased anxiety/ depression upon completion of intensive cardiac rehab Jabreel will have controlled or decreased anxiety/ depression upon completion of  intensive cardiac rehab [place] will have controlled or decreased anxiety/ depression upon completion of intensive cardiac rehab     Interventions Encouraged to attend Cardiac Rehabilitation for the exercise;Stress management education;Relaxation education Encouraged to attend Cardiac Rehabilitation for the exercise;Stress management education;Relaxation education Encouraged to attend Cardiac Rehabilitation for the exercise;Stress management education;Relaxation education     Continue Psychosocial Services  No Follow up required No Follow up required No Follow up required              Psychosocial Discharge (Final Psychosocial Re-Evaluation):  Psychosocial Re-Evaluation - 11/09/22 1518       Psychosocial Re-Evaluation   Current issues with Current Anxiety/Panic;History of Depression    Comments Barnabas Lister continues not to voce any concerns or stressors  at intensive cardiac rehab. Barnabas Lister will complete intensive cardiac rehab on 11/19/22    Expected Outcomes Joshoa will have controlled or decreased anxiety/ depression upon completion of intensive cardiac rehab    Interventions Encouraged to attend Cardiac Rehabilitation for the exercise;Stress management education;Relaxation education    Continue Psychosocial Services  No Follow up required             Vocational Rehabilitation: Provide vocational rehab assistance to qualifying candidates.   Vocational Rehab Evaluation & Intervention:  Vocational Rehab - 09/21/22 1402       Initial Vocational Rehab Evaluation & Intervention   Assessment shows need for Vocational Rehabilitation No   Barnabas Lister is retired and does not need vocational rehab at this time            Education: Education Goals: Education classes will be provided on a weekly basis, covering required topics. Participant will state understanding/return demonstration of topics presented.    Education     [place] Name 09/27/22 1200     Education   Cardiac Education Topics Pritikin    Tree surgeon  Select Nutrition   Nutrition Workshop Fueling a Designer, multimedia   Instruction Review Code 1- Verbalizes Understanding   Class Start Time 1145   Class Stop Time 1230   Class Time Calculation (min) 45 min    [place] Name 09/29/22 1200     Education   Cardiac Education Topics Pritikin   Financial trader   Weekly Topic International Cuisine- Spotlight on the [place]   Instruction Review Code 1- Verbalizes Understanding   Class Start Time 1135   Class Stop Time 1215   Class Time Calculation (min) 40 min    [place] Name 10/01/22 1200     Education   Cardiac Education Topics Pritikin   Select Workshops     Workshops   Educator Exercise Physiologist   Select Psychosocial   Psychosocial Workshop Recognizing and Reducing Stress   Instruction Review Code 1- Verbalizes Understanding   Class Start Time 1143   Class Stop Time 1228   Class Time Calculation (min) 45 min    [place] Name 10/06/22 1200     Education   Cardiac Education Topics Pritikin   Charity fundraiser Exercise Physiologist   Select Nutrition   Nutrition Cooking - Healthy Salads and Dressing   Instruction Review Code 1- Verbalizes Understanding   Class Start Time 1135   Class Stop Time 1220   Class Time Calculation (min) 45 min    [place] Name 10/08/22 1200     Education   Cardiac Education Topics Pritikin   Academic librarian General Education   General Education Heart Disease Risk Reduction   Instruction Review Code 1- Verbalizes Understanding   Class Start Time 1132   Class Stop Time 1205   Class Time Calculation (min) 33 min    [place] Name 10/13/22 1300     Education   Cardiac Education Topics Pritikin   Financial trader   Weekly Topic Fast and Healthy Breakfasts   Instruction  Review Code 1- Verbalizes Understanding   Class Start Time 1145   Class Stop Time 1225   Class Time Calculation (min) 40 min    [place] Name 10/15/22 1200     Education   Cardiac Education Topics Pritikin   Scientist, research (life sciences)   Educator Exercise Physiologist   Select Nutrition   Nutrition Overview of the [place]   Instruction Review Code 1- Verbalizes Understanding   Class Start Time 1140   Class Stop Time 1215   Class Time Calculation (min) 35 min    [place] Name 10/18/22 1200     Education   Cardiac Education Topics [place]   Select Workshops     Workshops   Educator Exercise Physiologist   Select Exercise   Exercise Workshop Hotel manager and Fall Prevention   Instruction Review Code 1- Verbalizes Understanding   Class Start Time 1140   Class Stop Time 1221   Class Time Calculation (min) 41 min    [place] Name 10/20/22 1300     Education   Cardiac Education Topics Pritikin   Financial trader   Weekly Topic Personalizing Your Pritikin Plate   Instruction Review Code 1- Verbalizes Understanding  Class Start Time 1145   Class Stop Time 1227   Class Time Calculation (min) 42 min    [place] Name 10/22/22 1200     Education   Cardiac Education Topics Pritikin   Charity fundraiser Exercise Physiologist   Select Psychosocial   Psychosocial Healthy Minds, Bodies, Hearts   Instruction Review Code 1- Verbalizes Understanding   Class Start Time 1140   Class Stop Time 1216   Class Time Calculation (min) 36 min    [place] Name 10/25/22 1300     Education   Cardiac Education Topics Pritikin   IT sales professional Nutrition   Nutrition Workshop Label Reading   Instruction Review Code 1- Verbalizes Understanding   Class Start Time 1145   Class Stop Time 1230   Class Time Calculation (min) 45 min    [place] Name 10/27/22 1300      Education   Cardiac Education Topics Pritikin   Financial trader   Weekly Topic [place]   Instruction Review Code 1- Verbalizes Understanding   Class Start Time 1138   Class Stop Time 1225   Class Time Calculation (min) 47 min    [place] Name 10/29/22 1200     Education   Cardiac Education Topics Pritikin   Lexicographer Nutrition   Nutrition Other  Label Reading   Instruction Review Code 1- Verbalizes Understanding   Class Start Time 1140   Class Stop Time 1225   Class Time Calculation (min) 45 min    [place] Name 11/01/22 1339     Education   Cardiac Education Topics Pritikin   [employer]     Workshops   Educator Exercise Physiologist   Select Psychosocial   Psychosocial Workshop Other  Focused goals and sustainable changes   Instruction Review Code 1- Verbalizes Understanding   Class Start Time 1138   Class Stop Time 1220   Class Time Calculation (min) 42 min    [place] Name 11/03/22 1600     Education   Cardiac Education Topics [employer]   Educator Dietitian   Weekly Topic Tasty Appetizers and Snacks   Instruction Review Code 1- Verbalizes Understanding   Class Start Time 1145   Class Stop Time 1221   Class Time Calculation (min) 36 min    [place] Name 11/05/22 1300     Education   Cardiac Education Topics Pritikin   Scientist, research (life sciences)   Educator Dietitian   Select Nutrition   Nutrition Calorie Density   Instruction Review Code 1- Verbalizes Understanding   Class Start Time 1142   Class Stop Time 1223   Class Time Calculation (min) 41 min    [place] Name 11/08/22 [place]   Select Workshops     Workshops   Educator Exercise Physiologist   Select Exercise   Exercise Workshop Exercise Basics: Building Your Action Plan   Instruction Review  Code 1- Verbalizes Understanding   Class Start Time 1148   Class Stop Time 1232   Class Time Calculation (min) 44 min  Core Videos: Exercise    Move It!  Clinical staff conducted group or individual video education with verbal and written material and guidebook.  Patient learns the recommended Pritikin exercise program. Exercise with the goal of living a long, healthy life. Some of the health benefits of exercise include controlled diabetes, healthier blood pressure levels, improved cholesterol levels, improved heart and lung capacity, improved sleep, and better body composition. Everyone should speak with their doctor before starting or changing an exercise routine.  Biomechanical Limitations Clinical staff conducted group or individual video education with verbal and written material and guidebook.  Patient learns how biomechanical limitations can impact exercise and how we can mitigate and possibly overcome limitations to have an impactful and balanced exercise routine.  Body Composition Clinical staff conducted group or individual video education with verbal and written material and guidebook.  Patient learns that body composition (ratio of muscle mass to fat mass) is a key component to assessing overall fitness, rather than body weight alone. Increased fat mass, especially visceral belly fat, can put [place] at increased risk for metabolic syndrome, type 2 diabetes, heart disease, and even death. It is recommended to combine diet and exercise (cardiovascular and resistance training) to improve your body composition. Seek guidance from your physician and exercise physiologist before implementing an exercise routine.  Exercise Action Plan Clinical staff conducted group or individual video education with verbal and written material and guidebook.  Patient learns the recommended strategies to achieve and enjoy long-term exercise adherence, including variety, self-motivation,  self-efficacy, and positive decision making. Benefits of exercise include fitness, good health, weight management, more energy, better sleep, less stress, and overall well-being.  Medical   Heart Disease Risk Reduction Clinical staff conducted group or individual video education with verbal and written material and guidebook.  Patient learns our heart is our most vital organ as it circulates oxygen, nutrients, white blood cells, and hormones throughout the entire body, and carries waste away. Data supports a plant-based eating plan like the Pritikin Program for its effectiveness in slowing progression of and reversing heart disease. The video provides a number of recommendations to address heart disease.   Metabolic Syndrome and Belly Fat  Clinical staff conducted group or individual video education with verbal and written material and guidebook.  Patient learns what metabolic syndrome is, how it leads to heart disease, and how one can reverse it and keep it from coming back. You have metabolic syndrome if you have 3 of the following 5 criteria: abdominal obesity, high blood pressure, high triglycerides, low HDL cholesterol, and high blood sugar.  Hypertension and Heart Disease Clinical staff conducted group or individual video education with verbal and written material and guidebook.  Patient learns that high blood pressure, or hypertension, is very common in the [place]. Hypertension is largely due to excessive salt intake, but other important risk factors include being overweight, physical inactivity, drinking too much alcohol, smoking, and not eating enough potassium from fruits and vegetables. High blood pressure is a leading risk factor for heart attack, stroke, congestive heart failure, dementia, kidney failure, and premature death. Long-term effects of excessive salt intake include stiffening of the arteries and thickening of heart muscle and organ damage. Recommendations include ways to  reduce hypertension and the risk of heart disease.  Diseases of Our Time - Focusing on Diabetes Clinical staff conducted group or individual video education with verbal and written material and guidebook.  Patient learns why the best way to stop diseases of our time  is prevention, through food and other lifestyle changes. Medicine (such as prescription pills and surgeries) is often only a Band-Aid on the problem, not a long-term solution. Most common diseases of our time include obesity, type 2 diabetes, hypertension, heart disease, and cancer. The Pritikin Program is recommended and has been proven to help reduce, reverse, and/or prevent the damaging effects of metabolic syndrome.  Nutrition   Overview of the Pritikin Eating Plan  Clinical staff conducted group or individual video education with verbal and written material and guidebook.  Patient learns about the [place] for disease risk reduction. The [place] emphasizes a wide variety of unrefined, minimally-processed carbohydrates, like fruits, vegetables, whole grains, and legumes. Go, Caution, and Stop food choices are explained. Plant-based and lean animal proteins are emphasized. Rationale provided for low sodium intake for blood pressure control, low added sugars for blood sugar stabilization, and low added fats and oils for coronary artery disease risk reduction and weight management.  Calorie Density  Clinical staff conducted group or individual video education with verbal and written material and guidebook.  Patient learns about calorie density and how it impacts the Pritikin Eating Plan. Knowing the characteristics of the food you choose will help you decide whether those foods will lead to weight gain or weight loss, and whether you want to consume more or less of them. Weight loss is usually a side effect of the Pritikin Eating Plan because of its focus on low calorie-dense foods.  Label Reading  Clinical staff  conducted group or individual video education with verbal and written material and guidebook.  Patient learns about the Pritikin recommended label reading guidelines and corresponding recommendations regarding calorie density, added sugars, sodium content, and whole grains.  Dining Out - Part 1  Clinical staff conducted group or individual video education with verbal and written material and guidebook.  Patient learns that restaurant meals can be sabotaging because they can be so high in calories, fat, sodium, and/or sugar. Patient learns recommended strategies on how to positively address this and avoid unhealthy pitfalls.  Facts on Fats  Clinical staff conducted group or individual video education with verbal and written material and guidebook.  Patient learns that lifestyle modifications can be just as effective, if not more so, as many medications for lowering your risk of heart disease. A Pritikin lifestyle can help to reduce your risk of inflammation and atherosclerosis (cholesterol build-up, or plaque, in the artery walls). Lifestyle interventions such as dietary choices and physical activity address the cause of atherosclerosis. A review of the types of fats and their impact on blood cholesterol levels, along with dietary recommendations to reduce fat intake is also included.  Nutrition Action Plan  Clinical staff conducted group or individual video education with verbal and written material and guidebook.  Patient learns how to incorporate Pritikin recommendations into their lifestyle. Recommendations include planning and keeping personal health goals in mind as an important part of their success.  Healthy Mind-Set    Healthy Minds, Bodies, Hearts  Clinical staff conducted group or individual video education with verbal and written material and guidebook.  Patient learns how to identify when they are stressed. Video will discuss the impact of that stress, as well as the many benefits of  stress management. Patient will also be introduced to stress management techniques. The way we think, act, and feel has an impact on our hearts.  How Our Thoughts Can Heal Our Hearts  Clinical staff conducted group or individual video  education with verbal and written material and guidebook.  Patient learns that negative thoughts can cause depression and anxiety. This can result in negative lifestyle behavior and serious health problems. Cognitive behavioral therapy is an effective method to help control our thoughts in order to change and improve our emotional outlook.  Additional Videos:  Exercise    Improving Performance  Clinical staff conducted group or individual video education with verbal and written material and guidebook.  Patient learns to use a non-linear approach by alternating intensity levels and lengths of time spent exercising to help burn more calories and lose more body fat. Cardiovascular exercise helps improve heart health, metabolism, hormonal balance, blood sugar control, and recovery from fatigue. Resistance training improves strength, endurance, balance, coordination, reaction time, metabolism, and muscle mass. Flexibility exercise improves circulation, posture, and balance. Seek guidance from your physician and exercise physiologist before implementing an exercise routine and learn your capabilities and proper form for all exercise.  Introduction to Yoga  Clinical staff conducted group or individual video education with verbal and written material and guidebook.  Patient learns about yoga, a discipline of the coming together of mind, breath, and body. The benefits of yoga include improved flexibility, improved range of motion, better posture and core strength, increased lung function, weight loss, and positive self-image. Yoga's heart health benefits include lowered blood pressure, healthier heart rate, decreased cholesterol and triglyceride levels, improved immune function,  and reduced stress. Seek guidance from your physician and exercise physiologist before implementing an exercise routine and learn your capabilities and proper form for all exercise.  Medical   Aging: Enhancing Your Quality of Life  Clinical staff conducted group or individual video education with verbal and written material and guidebook.  Patient learns key strategies and recommendations to stay in good physical health and enhance quality of life, such as prevention strategies, having an advocate, securing a [place], and keeping a list of medications and system for tracking them. It also discusses how to avoid risk for bone loss.  Biology of Weight Control  Clinical staff conducted group or individual video education with verbal and written material and guidebook.  Patient learns that weight gain occurs because we consume more calories than we burn (eating more, moving less). Even if your body weight is normal, you may have higher ratios of fat compared to muscle mass. Too much body fat puts you at increased risk for cardiovascular disease, heart attack, stroke, type 2 diabetes, and obesity-related cancers. In addition to exercise, following the [place] can help reduce your risk.  Decoding Lab Results  Clinical staff conducted group or individual video education with verbal and written material and guidebook.  Patient learns that lab test reflects one measurement whose values change over time and are influenced by many factors, including medication, stress, sleep, exercise, food, hydration, pre-existing medical conditions, and more. It is recommended to use the knowledge from this video to become more involved with your lab results and evaluate your numbers to speak with your doctor.   Diseases of Our Time - Overview  Clinical staff conducted group or individual video education with verbal and written material and guidebook.  Patient learns that  according to the CDC, 50% to 70% of chronic diseases (such as obesity, type 2 diabetes, elevated lipids, hypertension, and heart disease) are avoidable through lifestyle improvements including healthier food choices, listening to satiety cues, and increased physical activity.  Sleep Disorders Clinical staff conducted group or individual  video education with verbal and written material and guidebook.  Patient learns how good quality and duration of sleep are important to overall health and well-being. Patient also learns about sleep disorders and how they impact health along with recommendations to address them, including discussing with a physician.  Nutrition  Dining Out - Part 2 Clinical staff conducted group or individual video education with verbal and written material and guidebook.  Patient learns how to plan ahead and communicate in order to maximize their dining experience in a healthy and nutritious manner. Included are recommended food choices based on the type of restaurant the patient is visiting.   Fueling a Best boy conducted group or individual video education with verbal and written material and guidebook.  There is a strong connection between our food choices and our health. Diseases like obesity and type 2 diabetes are very prevalent and are in large-part due to lifestyle choices. The Pritikin Eating Plan provides plenty of food and hunger-curbing satisfaction. It is easy to follow, affordable, and helps reduce health risks.  Menu Workshop  Clinical staff conducted group or individual video education with verbal and written material and guidebook.  Patient learns that restaurant meals can sabotage health goals because they are often packed with calories, fat, sodium, and sugar. Recommendations include strategies to plan ahead and to communicate with the manager, chef, or server to help order a healthier meal.  Planning Your Eating Strategy  Clinical staff  conducted group or individual video education with verbal and written material and guidebook.  Patient learns about the [place] and its benefit of reducing the risk of disease. The [place] does not focus on calories. Instead, it emphasizes high-quality, nutrient-rich foods. By knowing the characteristics of the foods, we choose, we can determine their calorie density and make informed decisions.  Targeting Your Nutrition Priorities  Clinical staff conducted group or individual video education with verbal and written material and guidebook.  Patient learns that lifestyle habits have a tremendous impact on disease risk and progression. This video provides eating and physical activity recommendations based on your personal health goals, such as reducing LDL cholesterol, losing weight, preventing or controlling type 2 diabetes, and reducing high blood pressure.  Vitamins and Minerals  Clinical staff conducted group or individual video education with verbal and written material and guidebook.  Patient learns different ways to obtain key vitamins and minerals, including through a recommended healthy diet. It is important to discuss all supplements you take with your doctor.   Healthy Mind-Set    Smoking Cessation  Clinical staff conducted group or individual video education with verbal and written material and guidebook.  Patient learns that cigarette smoking and tobacco addiction pose a serious health risk which affects millions of people. Stopping smoking will significantly reduce the risk of heart disease, lung disease, and many forms of cancer. Recommended strategies for quitting are covered, including working with your doctor to develop a successful plan.  Culinary   Becoming a Financial trader conducted group or individual video education with verbal and written material and guidebook.  Patient learns that cooking at home can be healthy, cost-effective,  quick, and puts them in control. Keys to cooking healthy recipes will include looking at your recipe, assessing your equipment needs, planning ahead, making it simple, choosing cost-effective seasonal ingredients, and limiting the use of added fats, salts, and sugars.  Cooking - Breakfast and Snacks  Clinical staff conducted group or individual  video education with verbal and written material and guidebook.  Patient learns how important breakfast is to satiety and nutrition through the entire day. Recommendations include key foods to eat during breakfast to help stabilize blood sugar levels and to prevent overeating at meals later in the day. Planning ahead is also a key component.  Cooking - Human resources officer conducted group or individual video education with verbal and written material and guidebook.  Patient learns eating strategies to improve overall health, including an approach to cook more at home. Recommendations include thinking of animal protein as a side on your plate rather than center stage and focusing instead on lower calorie dense options like vegetables, fruits, whole grains, and plant-based proteins, such as beans. Making sauces in large quantities to freeze for later and leaving the skin on your vegetables are also recommended to maximize your experience.  Cooking - Healthy Salads and Dressing Clinical staff conducted group or individual video education with verbal and written material and guidebook.  Patient learns that vegetables, fruits, whole grains, and legumes are the foundations of the [place]. Recommendations include how to incorporate each of these in flavorful and healthy salads, and how to create homemade salad dressings. Proper handling of ingredients is also covered. Cooking - Soups and [employer] - Soups and Desserts Clinical staff conducted group or individual video education with verbal and written material and guidebook.  Patient  learns that Pritikin soups and desserts make for easy, nutritious, and delicious snacks and meal components that are low in sodium, fat, sugar, and calorie density, while high in vitamins, minerals, and filling fiber. Recommendations include simple and healthy ideas for soups and desserts.   Overview     The Pritikin Solution Program Overview Clinical staff conducted group or individual video education with verbal and written material and guidebook.  Patient learns that the results of the [place] Program have been documented in more than 100 articles published in peer-reviewed journals, and the benefits include reducing risk factors for (and, in some cases, even reversing) high cholesterol, high blood pressure, type 2 diabetes, obesity, and more! An overview of the three key pillars of the Pritikin Program will be covered: eating well, doing regular exercise, and having a healthy mind-set.  WORKSHOPS  Exercise: Exercise Basics: Building Your Action Plan Clinical staff led group instruction and group discussion with PowerPoint presentation and patient guidebook. To enhance the learning environment the use of posters, models and videos may be added. At the conclusion of this workshop, patients will comprehend the difference between physical activity and exercise, as well as the benefits of incorporating both, into their routine. Patients will understand the FITT (Frequency, Intensity, Time, and Type) principle and how to use it to build an exercise action plan. In addition, safety concerns and other considerations for exercise and cardiac rehab will be addressed by the presenter. The purpose of this lesson is to promote a comprehensive and effective weekly exercise routine in order to improve patients' overall level of fitness.   Managing Heart Disease: Your Path to a Healthier Heart Clinical staff led group instruction and group discussion with PowerPoint presentation and patient guidebook. To  enhance the learning environment the use of posters, models and videos may be added.At the conclusion of this workshop, patients will understand the anatomy and physiology of the heart. Additionally, they will understand how Pritikin's three pillars impact the risk factors, the progression, and the management of heart disease.  The purpose of  this lesson is to provide a high-level overview of the heart, heart disease, and how the Pritikin lifestyle positively impacts risk factors.  Exercise Biomechanics Clinical staff led group instruction and group discussion with PowerPoint presentation and patient guidebook. To enhance the learning environment the use of posters, models and videos may be added. Patients will learn how the structural parts of their bodies function and how these functions impact their daily activities, movement, and exercise. Patients will learn how to promote a neutral spine, learn how to manage pain, and identify ways to improve their physical movement in order to promote healthy living. The purpose of this lesson is to expose patients to common physical limitations that impact physical activity. Participants will learn practical ways to adapt and manage aches and pains, and to minimize their effect on regular exercise. Patients will learn how to maintain good posture while sitting, walking, and lifting.  Balance Training and Fall Prevention  Clinical staff led group instruction and group discussion with PowerPoint presentation and patient guidebook. To enhance the learning environment the use of posters, models and videos may be added. At the conclusion of this workshop, patients will understand the importance of their sensorimotor skills (vision, proprioception, and the vestibular system) in maintaining their ability to balance as they age. Patients will apply a variety of balancing exercises that are appropriate for their current level of function. Patients will understand  the common causes for poor balance, possible solutions to these problems, and ways to modify their physical environment in order to minimize their fall risk. The purpose of this lesson is to teach patients about the importance of maintaining balance as they age and ways to minimize their risk of falling.  WORKSHOPS   Nutrition:  Fueling a Scientist, research (physical sciences) led group instruction and group discussion with PowerPoint presentation and patient guidebook. To enhance the learning environment the use of posters, models and videos may be added. Patients will review the foundational principles of the [place] and understand what constitutes a serving size in each of the food groups. Patients will also learn Pritikin-friendly foods that are better choices when away from home and review make-ahead meal and snack options. Calorie density will be reviewed and applied to three nutrition priorities: weight maintenance, weight loss, and weight gain. The purpose of this lesson is to reinforce (in a group setting) the key concepts around what patients are recommended to eat and how to apply these guidelines when away from home by planning and selecting Pritikin-friendly options. Patients will understand how calorie density may be adjusted for different weight management goals.  Mindful Eating  Clinical staff led group instruction and group discussion with PowerPoint presentation and patient guidebook. To enhance the learning environment the use of posters, models and videos may be added. Patients will briefly review the concepts of the [place] and the importance of low-calorie dense foods. The concept of mindful eating will be introduced as well as the importance of paying attention to internal hunger signals. Triggers for non-hunger eating and techniques for dealing with triggers will be explored. The purpose of this lesson is to provide patients with the opportunity to review the basic  principles of the [place], discuss the value of eating mindfully and how to measure internal cues of hunger and fullness using the Hunger Scale. Patients will also discuss reasons for non-hunger eating and learn strategies to use for controlling emotional eating.  Targeting Your Nutrition Priorities Clinical staff led group instruction  and group discussion with PowerPoint presentation and patient guidebook. To enhance the learning environment the use of posters, models and videos may be added. Patients will learn how to determine their genetic susceptibility to disease by reviewing their family history. Patients will gain insight into the importance of diet as part of an overall healthy lifestyle in mitigating the impact of genetics and other environmental insults. The purpose of this lesson is to provide patients with the opportunity to assess their personal nutrition priorities by looking at their family history, their own health history and current risk factors. Patients will also be able to discuss ways of prioritizing and modifying the [place] for their highest risk areas  Menu  Clinical staff led group instruction and group discussion with PowerPoint presentation and patient guidebook. To enhance the learning environment the use of posters, models and videos may be added. Using menus brought in from [employer], or printed from [employer], patients will apply the [place] dining out guidelines that were presented in the [employer] video. Patients will also be able to practice these guidelines in a variety of provided scenarios. The purpose of this lesson is to provide patients with the opportunity to practice hands-on learning of the [place] with actual menus and practice scenarios.  Label Reading Clinical staff led group instruction and group discussion with PowerPoint presentation and patient guidebook. To enhance the  learning environment the use of posters, models and videos may be added. Patients will review and discuss the Pritikin label reading guidelines presented in Pritikin's Label Reading Educational series video. Using fool labels brought in from local grocery stores and markets, patients will apply the label reading guidelines and determine if the packaged food meet the Pritikin guidelines. The purpose of this lesson is to provide patients with the opportunity to review, discuss, and practice hands-on learning of the Pritikin Label Reading guidelines with actual packaged food labels. [place] Workshops are designed to teach patients ways to prepare quick, simple, and affordable recipes at home. The importance of nutrition's role in chronic disease risk reduction is reflected in its emphasis in the overall Pritikin program. By learning how to prepare essential core Pritikin Eating Plan recipes, patients will increase control over what they eat; be able to customize the flavor of foods without the use of added salt, sugar, or fat; and improve the quality of the food they consume. By learning a set of core recipes which are easily assembled, quickly prepared, and affordable, patients are more likely to prepare more healthy foods at home. These workshops focus on convenient breakfasts, simple entres, side dishes, and desserts which can be prepared with minimal effort and are consistent with nutrition recommendations for cardiovascular risk reduction. Cooking [employer] are taught by a Engineer, materials (RD) who has been trained by the [employer]. The chef or RD has a clear understanding of the importance of minimizing - if not completely eliminating - added fat, sugar, and sodium in recipes. Throughout the series of [place] Workshop sessions, patients will learn about healthy ingredients and efficient methods of cooking to build confidence in their  capability to prepare    Cooking School weekly topics:  Adding Flavor- Sodium-Free  Fast and Healthy Breakfasts  Powerhouse Plant-Based Proteins  Satisfying Salads and Dressings  Simple Sides and Sauces  International Cuisine-Spotlight on the [place] Zones  Delicious Desserts  Savory Soups  [employer] - Meals in  a Snap  Tasty Appetizers and Snacks  Comforting Weekend Breakfasts  One-Pot Wonders   Fast Evening Meals  Easy Entertaining  Personalizing Your Pritikin Plate  WORKSHOPS   Healthy Mindset (Psychosocial): New Thoughts, New Behaviors Clinical staff led group instruction and group discussion with PowerPoint presentation and patient guidebook. To enhance the learning environment the use of posters, models and videos may be added. Patients will learn and practice techniques for developing effective health and lifestyle goals. Patients will be able to effectively apply the goal setting process learned to develop at least one new personal goal.  The purpose of this lesson is to expose patients to a new skill set of behavior modification techniques such as techniques setting SMART goals, overcoming barriers, and achieving new thoughts and new behaviors.  Managing Moods and Relationships Clinical staff led group instruction and group discussion with PowerPoint presentation and patient guidebook. To enhance the learning environment the use of posters, models and videos may be added. Patients will learn how emotional and chronic stress factors can impact their health and relationships. They will learn healthy ways to manage their moods and utilize positive coping mechanisms. In addition, ICR patients will learn ways to improve communication skills. The purpose of this lesson is to expose patients to ways of understanding how one's mood and health are intimately connected. Developing a healthy outlook can help build positive relationships and connections with others. Patients will  understand the importance of utilizing effective communication skills that include actively listening and being heard. They will learn and understand the importance of the "4 Cs" and especially Connections in fostering of a Healthy Mind-Set.  Healthy Sleep for a Healthy Heart Clinical staff led group instruction and group discussion with PowerPoint presentation and patient guidebook. To enhance the learning environment the use of posters, models and videos may be added. At the conclusion of this workshop, patients will be able to demonstrate knowledge of the importance of sleep to overall health, well-being, and quality of life. They will understand the symptoms of, and treatments for, common sleep disorders. Patients will also be able to identify daytime and nighttime behaviors which impact sleep, and they will be able to apply these tools to help manage sleep-related challenges. The purpose of this lesson is to provide patients with a general overview of sleep and outline the importance of quality sleep. Patients will learn about a few of the most common sleep disorders. Patients will also be introduced to the concept of "sleep hygiene," and discover ways to self-manage certain sleeping problems through simple daily behavior changes. Finally, the workshop will motivate patients by clarifying the links between quality sleep and their goals of heart-healthy living.   Recognizing and Reducing Stress Clinical staff led group instruction and group discussion with PowerPoint presentation and patient guidebook. To enhance the learning environment the use of posters, models and videos may be added. At the conclusion of this workshop, patients will be able to understand the types of stress reactions, differentiate between acute and chronic stress, and recognize the impact that chronic stress has on their health. They will also be able to apply different coping mechanisms, such as reframing negative self-talk.  Patients will have the opportunity to practice a variety of stress management techniques, such as deep abdominal breathing, progressive muscle relaxation, and/or guided imagery.  The purpose of this lesson is to educate patients on the role of stress in their lives and to provide healthy techniques for coping with it.  Learning Barriers/Preferences:  Learning  Barriers/Preferences - 09/21/22 1400       Learning Barriers/Preferences   Learning Barriers Exercise Concerns   Barnabas Lister says that he has some dizziness from taking beta blocker which was recently added   Learning Preferences Verbal Instruction;Pictoral;Audio;Video             Education Topics:  Knowledge Questionnaire Score:  Knowledge Questionnaire Score - 09/21/22 1322       Knowledge Questionnaire Score   Pre Score 20/24             Core Components/Risk Factors/Patient Goals at Admission:  Personal Goals and Risk Factors at Admission - 09/21/22 1322       Core Components/Risk Factors/Patient Goals on Admission    Weight Management Yes;Weight Loss    Intervention Weight Management: Provide education and appropriate resources to help participant work on and attain dietary goals.;Weight Management: Develop a combined nutrition and exercise program designed to reach desired caloric intake, while maintaining appropriate intake of nutrient and fiber, sodium and fats, and appropriate energy expenditure required for the weight goal.    Admit Weight 208 lb 15.9 oz (94.8 kg)    Goal Weight: Short Term 200 lb (90.7 kg)    Goal Weight: Long Term 195 lb (88.5 kg)    Expected Outcomes Short Term: Continue to assess and modify interventions until short term weight is achieved;Long Term: Adherence to nutrition and physical activity/exercise program aimed toward attainment of established weight goal;Weight Loss: Understanding of general recommendations for a balanced deficit meal plan, which promotes 1-2 lb weight loss per week and  includes a negative energy balance of [phone] kcal/d    Hypertension Yes    Intervention Provide education on lifestyle modifcations including regular physical activity/exercise, weight management, moderate sodium restriction and increased consumption of fresh fruit, vegetables, and low fat dairy, alcohol moderation, and smoking cessation.;Monitor prescription use compliance.    Expected Outcomes Short Term: Continued assessment and intervention until BP is < 140/29m HG in hypertensive participants. < 130/811mHG in hypertensive participants with diabetes, heart failure or chronic kidney disease.;Long Term: Maintenance of blood pressure at goal levels.    Lipids Yes    Intervention Provide education and support for participant on nutrition & aerobic/resistive exercise along with prescribed medications to achieve LDL '70mg'$ , HDL >'40mg'$ .    Expected Outcomes Short Term: Participant states understanding of desired cholesterol values and is compliant with medications prescribed. Participant is following exercise prescription and nutrition guidelines.;Long Term: Cholesterol controlled with medications as prescribed, with individualized exercise RX and with personalized nutrition plan. Value goals: LDL < '70mg'$ , HDL > 40 mg.             Core Components/Risk Factors/Patient Goals Review:   Goals and Risk Factor Review     Row Name 09/27/22 1408 10/12/22 0825 11/09/22 1522         Core Components/Risk Factors/Patient Goals Review   Personal Goals Review Weight Management/Obesity;Hypertension;Lipids Weight Management/Obesity;Hypertension;Lipids Weight Management/Obesity;Hypertension;Lipids     Review JaBarnabas Listertarted intensive cardiac rehab on 09/27/22 and did well with exercise. Vital signs were stable JaBarnabas Listeras been doing  well with exercise at intensive cardiac rehab. Vital signs have stable. JaBarnabas Listeras lost 1.8 kg since starting intensive cardiac rehab JaBarnabas Listeras been doing  well with exercise at intensive  cardiac rehab. Vital signs have been stable. JaBarnabas Listeras lost 5.1 kg since starting intensive cardiac rehab which is a goal JaBarnabas [place]!  JaBarnabas Listerill complete intensive cardiac rehab on 11/19/22     Expected  Outcomes Barnabas Lister will have dcreased stress upon completion on intensive cardiac rehab Barnabas Lister will continue to participate in intensive cardiac rehab for exercise, nutrition and lifestyle modifications Barnabas Lister will continue to participate in intensive cardiac rehab for exercise, nutrition and lifestyle modifications              Core Components/Risk Factors/Patient Goals at Discharge (Final Review):   Goals and Risk Factor Review - 11/09/22 1522       Core Components/Risk Factors/Patient Goals Review   Personal Goals Review Weight Management/Obesity;Hypertension;Lipids    Review Barnabas Lister has been doing  well with exercise at intensive cardiac rehab. Vital signs have been stable. Barnabas Lister has lost 5.1 kg since starting intensive cardiac rehab which is a goal Barnabas Lister set!  Barnabas Lister will complete intensive cardiac rehab on 11/19/22    Expected Outcomes Barnabas Lister will continue to participate in intensive cardiac rehab for exercise, nutrition and lifestyle modifications             ITP Comments:  ITP Comments     Row Name 09/21/22 2902 09/27/22 1403 10/12/22 0822 11/09/22 1516     ITP Comments Medical Director- Dr. Fransico Him, MD.  Introduction to Pritikin Education Program/Intensive Cardiac rehab. Initial Orientation packet reviewed with the patient. 30 Day ITP Review. Barnabas Lister started intensive cardiac rehab on 09/27/22 and did well with exercise 30 Day ITP Review. Barnabas Lister has good attendance and participation in intensive cardiac rehab and is off to a good start with exercise 30 Day ITP Review. Barnabas Lister has good attendance and participation in intensive cardiac rehab. Barnabas Lister will complete intensive cardiac rehab on 11/19/22             Comments: See ITP Comments

## 2022-11-10 ENCOUNTER — Encounter (HOSPITAL_COMMUNITY)
Admission: RE | Admit: 2022-11-10 | Discharge: 2022-11-10 | Disposition: A | Discharge status: Home / Self Care | Payer: PPO | Source: Ambulatory Visit | Attending: Internal Medicine | Admitting: Internal Medicine

## 2022-11-10 DIAGNOSIS — Z48812 Encounter for surgical aftercare following surgery on the circulatory system: Secondary | ICD-10-CM | POA: Diagnosis not present

## 2022-11-10 DIAGNOSIS — Z955 Presence of coronary angioplasty implant and graft: Secondary | ICD-10-CM

## 2022-11-12 ENCOUNTER — Encounter (HOSPITAL_COMMUNITY): Payer: PPO

## 2022-11-15 ENCOUNTER — Encounter (HOSPITAL_COMMUNITY)
Admission: RE | Admit: 2022-11-15 | Discharge: 2022-11-15 | Disposition: A | Discharge status: Home / Self Care | Payer: PPO | Source: Ambulatory Visit | Attending: Internal Medicine | Admitting: Internal Medicine

## 2022-11-15 DIAGNOSIS — Z955 Presence of coronary angioplasty implant and graft: Secondary | ICD-10-CM | POA: Diagnosis not present

## 2022-11-15 DIAGNOSIS — Z48812 Encounter for surgical aftercare following surgery on the circulatory system: Secondary | ICD-10-CM | POA: Diagnosis not present

## 2022-11-17 ENCOUNTER — Encounter (HOSPITAL_COMMUNITY)
Admission: RE | Admit: 2022-11-17 | Discharge: 2022-11-17 | Disposition: A | Discharge status: Home / Self Care | Payer: PPO | Source: Ambulatory Visit | Attending: Internal Medicine | Admitting: Internal Medicine

## 2022-11-17 DIAGNOSIS — Z955 Presence of coronary angioplasty implant and graft: Secondary | ICD-10-CM | POA: Diagnosis not present

## 2022-11-19 ENCOUNTER — Encounter (HOSPITAL_COMMUNITY)
Admission: RE | Admit: 2022-11-19 | Discharge: 2022-11-19 | Disposition: A | Discharge status: Home / Self Care | Payer: PPO | Source: Ambulatory Visit | Attending: Internal Medicine | Admitting: Internal Medicine

## 2022-11-19 DIAGNOSIS — Z955 Presence of coronary angioplasty implant and graft: Secondary | ICD-10-CM

## 2022-11-22 ENCOUNTER — Encounter (HOSPITAL_COMMUNITY)
Admission: RE | Admit: 2022-11-22 | Discharge: 2022-11-22 | Disposition: A | Discharge status: Home / Self Care | Payer: PPO | Source: Ambulatory Visit | Attending: Internal Medicine | Admitting: Internal Medicine

## 2022-11-22 DIAGNOSIS — Z955 Presence of coronary angioplasty implant and graft: Secondary | ICD-10-CM

## 2022-11-24 ENCOUNTER — Encounter (HOSPITAL_COMMUNITY)
Admission: RE | Admit: 2022-11-24 | Discharge: 2022-11-24 | Disposition: A | Discharge status: Home / Self Care | Payer: PPO | Source: Ambulatory Visit | Attending: Internal Medicine | Admitting: Internal Medicine

## 2022-11-24 VITALS — BP 128/60 | HR 70 | Ht 76.5 in | Wt 200.8 lb

## 2022-11-24 DIAGNOSIS — Z955 Presence of coronary angioplasty implant and graft: Secondary | ICD-10-CM | POA: Diagnosis not present

## 2022-11-24 NOTE — Progress Notes (Signed)
Discharge Progress Report  Patient Details  Name: Patrick Noble. MRN: [id number] Date of Birth: 1951-07-22 Referring Provider:   Flowsheet Row INTENSIVE CARDIAC REHAB ORIENT from 09/21/2022 in [facility]  Referring Provider Pixie Casino, MD        Number of Visits: 69  Reason for Discharge:  Patient reached a stable level of exercise. Patient independent in their exercise. Patient has met program and personal goals.  Smoking History:  Social History   Tobacco Use  Smoking Status Former  Smokeless Tobacco Never    Diagnosis:  07/01/22 DES pLAD/OM2  ADL UCSD:   Initial Exercise Prescription:  Initial Exercise Prescription - 09/21/22 1300       Date of Initial Exercise RX and Referring Provider   Date 09/21/22    Referring Provider Pixie Casino, MD    Expected Discharge Date 11/19/21      Recumbant Bike   Level --    Watts --    Minutes --    METs --      NuStep   Level 2    SPM 85    Minutes 15    METs 2.5      Recumbant Elliptical   Level 1    Watts 25    Minutes 15    METs 2.8      Prescription Details   Frequency (times per week) 3    Duration Progress to 30 minutes of continuous aerobic without signs/symptoms of physical distress      Intensity   THRR 40-80% of Max Heartrate 60-121    Ratings of Perceived Exertion 11-13    Perceived Dyspnea 0-4      Progression   Progression Continue to progress workloads to maintain intensity without signs/symptoms of physical distress.      Resistance Training   Training Prescription Yes    Weight 4 lbs    Reps 10-15             Discharge Exercise Prescription (Final Exercise Prescription Changes):  Exercise Prescription Changes - 11/24/22 1021       Response to Exercise   Blood Pressure (Admit) 128/60    Blood Pressure (Exercise) 142/80    Blood Pressure (Exit) 100/68    Heart Rate (Admit) 70 bpm    Heart Rate (Exercise)  99 bpm    Heart Rate (Exit) 69 bpm    Rating of Perceived Exertion (Exercise) 12    Symptoms None    Duration Continue with 30 min of aerobic exercise without signs/symptoms of physical distress.    Intensity THRR unchanged      Progression   Progression Continue to progress workloads to maintain intensity without signs/symptoms of physical distress.    Average METs 3.8      Resistance Training   Training Prescription No   Relaxation day, no weights.     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   Frequency Add 4  additional days to program exercise sessions.    Initial Home Exercises Provided 10/13/22             Functional Capacity:  6 Minute Walk     Row Name 09/21/22 1110 11/19/22 1040       6 Minute Walk   Phase Initial Discharge    Distance 1526 feet 1652 feet    Distance % Change -- 8.26 %    Distance Feet Change -- 126 ft    Walk Time 6 minutes 6 minutes    # of Rest Breaks 0 0    MPH 2.89 3.13    METS 3.9 3.89    RPE 11 13    Perceived Dyspnea  1 0    VO2 Peak 13.66 13.63    Symptoms Yes (comment) Yes (comment)    Comments Mild shortness of breath, RPD=1; mild back pain, chronic= 1/10 on the pain scale. Symptoms resolved with rest. Patient c/o thoracic spine back pain, chronic= 4-5/10 on the pain scale. Symptoms resolved with rest.    Resting HR 81 bpm 64 bpm    Resting BP 98/64 108/78    Resting Oxygen Saturation  98 % --    Exercise Oxygen Saturation  during 6 min walk 97 % --    Max Ex. HR 103 bpm 82 bpm    Max Ex. BP 158/70 148/78    2 Minute Post BP 112/68 108/68             Psychological, QOL, Others - Outcomes: PHQ 2/9:    11/24/2022   11:55 AM  09/21/2022    2:03 PM  Depression screen PHQ 2/9  Decreased Interest 1 0  Down, Depressed, Hopeless 1 0  PHQ - 2 Score 2 0  Altered sleeping 0   Tired, decreased energy 0   Change in appetite 0   Feeling bad or failure about yourself  0   Trouble concentrating 0   Moving slowly or fidgety/restless 0   Suicidal thoughts 0   PHQ-9 Score 2   Difficult doing work/chores Not difficult at all     Quality of Life:  Quality of Life - 11/16/22 0912       Quality of Life   Select Quality of Life      Quality of Life Scores   Health/Function Pre 25.47 %    Health/Function Post 24.97 %    Health/Function % Change -1.96 %    Socioeconomic Pre 25.79 %    Socioeconomic Post 22.86 %    Socioeconomic % Change  -11.36 %    Psych/Spiritual Pre 24.71 %    Psych/Spiritual Post 22.5 %    Psych/Spiritual % Change -8.94 %    Family Pre 27.6 %    Family Post 24 %    Family % Change -13.04 %    GLOBAL Pre 25.69 %    GLOBAL Post 23.88 %    GLOBAL % Change -7.05 %             Personal Goals: Goals established at orientation with interventions provided to work toward goal.  Personal Goals and Risk Factors at Admission - 09/21/22 1322       Core Components/Risk Factors/Patient Goals on Admission    Weight Management Yes;Weight Loss    Intervention Weight Management: Provide education and appropriate resources to help participant work on and attain dietary goals.;Weight Management: Develop a combined nutrition and exercise program designed to reach desired caloric intake, while maintaining appropriate  intake of nutrient and fiber, sodium and fats, and appropriate energy expenditure required for the weight goal.    Admit Weight 208 lb 15.9 oz (94.8 kg)    Goal Weight: Short Term 200 lb (90.7 kg)    Goal Weight: Long Term 195 lb (88.5 kg)    Expected Outcomes Short Term: Continue to assess and modify interventions until short term weight is achieved;Long Term: Adherence to nutrition and  physical activity/exercise program aimed toward attainment of established weight goal;Weight Loss: Understanding of general recommendations for a balanced deficit meal plan, which promotes 1-2 lb weight loss per week and includes a negative energy balance of [phone] kcal/d    Hypertension Yes    Intervention Provide education on lifestyle modifcations including regular physical activity/exercise, weight management, moderate sodium restriction and increased consumption of fresh fruit, vegetables, and low fat dairy, alcohol moderation, and smoking cessation.;Monitor prescription use compliance.    Expected Outcomes Short Term: Continued assessment and intervention until BP is < 140/76m HG in hypertensive participants. < 130/880mHG in hypertensive participants with diabetes, heart failure or chronic kidney disease.;Long Term: Maintenance of blood pressure at goal levels.    Lipids Yes    Intervention Provide education and support for participant on nutrition & aerobic/resistive exercise along with prescribed medications to achieve LDL '70mg'$ , HDL >'40mg'$ .    Expected Outcomes Short Term: Participant states understanding of desired cholesterol values and is compliant with medications prescribed. Participant is following exercise prescription and nutrition guidelines.;Long Term: Cholesterol controlled with medications as prescribed, with individualized exercise RX and with personalized nutrition plan. Value goals: LDL < '70mg'$ , HDL > 40 mg.              Personal Goals Discharge:  Goals and Risk Factor Review     Row Name 09/27/22 1408 10/12/22 0825 11/09/22 1522         Core Components/Risk Factors/Patient Goals Review   Personal Goals Review Weight Management/Obesity;Hypertension;Lipids Weight Management/Obesity;Hypertension;Lipids Weight Management/Obesity;Hypertension;Lipids     Review Patrick Listertarted intensive cardiac rehab on 09/27/22 and did well with exercise. Vital signs were stable Patrick Listeras  been doing  well with exercise at intensive cardiac rehab. Vital signs have stable. Patrick Listeras lost 1.8 kg since starting intensive cardiac rehab Patrick Listeras been doing  well with exercise at intensive cardiac rehab. Vital signs have been stable. Patrick Listeras lost 5.1 kg since starting intensive cardiac rehab which is a goal Patrick [place]!  Patrick Listerill complete intensive cardiac rehab on 11/19/22     Expected Outcomes Patrick Listerill have dcreased stress upon completion on intensive cardiac rehab Patrick Listerill continue to participate in intensive cardiac rehab for exercise, nutrition and lifestyle modifications Patrick Listerill continue to participate in intensive cardiac rehab for exercise, nutrition and lifestyle modifications              Exercise Goals and Review:  Exercise Goals     Row Name 09/21/22 1100             Exercise Goals   Increase Physical Activity Yes       Intervention Provide advice, education, support and counseling about physical activity/exercise needs.;Develop an individualized exercise prescription for aerobic and resistive training based on initial evaluation findings, risk stratification, comorbidities and participant's personal goals.       Expected Outcomes Short Term: Attend rehab on a regular basis to increase amount of physical activity.;Long Term: Exercising regularly at least 3-5 days a week.       Increase Strength  and Stamina Yes       Intervention Provide advice, education, support and counseling about physical activity/exercise needs.;Develop an individualized exercise prescription for aerobic and resistive training based on initial evaluation findings, risk stratification, comorbidities and participant's personal goals.       Expected Outcomes Short Term: Increase workloads from initial exercise prescription for resistance, speed, and METs.;Short Term: Perform resistance training exercises routinely during rehab and add in resistance training at home;Long Term: Improve cardiorespiratory  fitness, muscular endurance and strength as measured by increased METs and functional capacity (6MWT)       Able to understand and use rate of perceived exertion (RPE) scale Yes       Intervention Provide education and explanation on how to use RPE scale       Expected Outcomes Short Term: Able to use RPE daily in rehab to express subjective intensity level;Long Term:  Able to use RPE to guide intensity level when exercising independently       Knowledge and understanding of Target Heart Rate Range (THRR) Yes       Intervention Provide education and explanation of THRR including how the numbers were predicted and where they are located for reference       Expected Outcomes Short Term: Able to state/look up THRR;Long Term: Able to use THRR to govern intensity when exercising independently;Short Term: Able to use daily as guideline for intensity in rehab       Able to check pulse independently Yes       Intervention Provide education and demonstration on how to check pulse in carotid and radial arteries.;Review the importance of being able to check your own pulse for safety during independent exercise       Expected Outcomes Short Term: Able to explain why pulse checking is important during independent exercise;Long Term: Able to check pulse independently and accurately       Understanding of Exercise Prescription Yes       Intervention Provide education, explanation, and written materials on patient's individual exercise prescription       Expected Outcomes Short Term: Able to explain program exercise prescription;Long Term: Able to explain home exercise prescription to exercise independently                Exercise Goals Re-Evaluation:  Exercise Goals Re-Evaluation     Row Name 09/27/22 1302 10/13/22 1041 11/05/22 1111 11/19/22 1049 11/24/22 1112     Exercise Goal Re-Evaluation   Exercise Goals Review Increase Physical Activity;Able to understand and use rate of perceived exertion (RPE)  scale Increase Physical Activity;Able to understand and use rate of perceived exertion (RPE) scale;Understanding of Exercise Prescription;Increase Strength and Stamina;Knowledge and understanding of Target Heart Rate Range (THRR) Increase Physical Activity;Able to understand and use rate of perceived exertion (RPE) scale;Understanding of Exercise Prescription;Increase Strength and Stamina;Knowledge and understanding of Target Heart Rate Range (THRR) Increase Physical Activity;Able to understand and use rate of perceived exertion (RPE) scale;Understanding of Exercise Prescription;Increase Strength and Stamina;Knowledge and understanding of Target Heart Rate Range (THRR) Increase Physical Activity;Able to understand and use rate of perceived exertion (RPE) scale;Understanding of Exercise Prescription;Increase Strength and Stamina;Knowledge and understanding of Target Heart Rate Range (THRR)   Comments Patient able to understand and use RPE scale appropriately. Reviewed exercise prescription with patient. Patient is walking daily and plans to exercise at fitness center. Patient is making great progress with exercise. Patient's goal is to increase stamina and to have less back pain. Patient walks daily at home  and has gym access through [employer]. Patient recently tried the upright elliptical at the gym and found it was bothered his knees. Patient will look for more recumbent machines at the gym and also has access to his daughter's recumbent bike, which he also might try. Patient is using resistance bands at home and may also start using the weight machines at the gym with low weight and higher reps (10-15) as tolerated. Patient has some stretches from PT for his back that he is also doing. Patient plans to continue exercise at fitness center upon completion of the program. Patient scheduled to complete the cardiac rehab program next Wednesday. Patient's functional capacity increased 8% as measured by 6MWT.  Patient has lost 9 lbs since orientation. Patient is a member at the [facility] and plans to continue exercise there at least 30 minutes 4-5 days/week. Discussed the PREP program at the [employer], brochure given. Patient wants to increase his core strength to help with balance. Patient will look into participation in the PREP program. Patient completed the cardiac rehab program and progressed well achieving 3.8 METs. Patient is very motivated to keep up his exercise routine. Patient walks with his dogs 1-2 miles daily and will exercise at [employer] and the [place] [facility] 3-4 days/week. Patient is looking into working with a PT at [employer] and may participate in the [place] program at [place] [facility] in the future. Patient has been experiencing some worsening back pain, and I encouraged him to contact his physician, which he plans to do.   Expected Outcomes Progress workloads as tolerated to increase cardiorespiratory fitness. Patient will continue daily exercise routine walking and will incorporate exercise at fitness center. Patient will continue daily exercise routine walking and will incorporate exercise at fitness center. Patient will continue daily exercise routine walking and will incorporate exercise at fitness center. Patient will continue daily exercise routine to maintain health and fitness gains.            Nutrition & Weight - Outcomes:  Pre Biometrics - 09/21/22 0953       Pre Biometrics   Waist Circumference 41.75 inches    Hip Circumference 42 inches    Waist to Hip Ratio 0.99 %    Triceps Skinfold 11 mm    % Body Fat 25.5 %    Grip Strength 52 kg    Flexibility --   Not performed, chronic back pain.   Single Leg Stand 14.37 seconds             Post Biometrics - 11/24/22 1056        Post  Biometrics   Waist Circumference 38.5 inches    Hip Circumference 42 inches    Waist to Hip Ratio 0.92 %    Triceps Skinfold 10 mm    % Body Fat 23.3 %    Grip Strength 51 kg     Flexibility 14.5 in    Single Leg Stand 6.93 seconds             Nutrition:  Nutrition Therapy & Goals - 10/27/22 1408       Nutrition Therapy   Diet Heart Healthy Diet    Drug/Food Interactions Statins/Certain Fruits      Personal Nutrition Goals   Nutrition Goal Patient to identify strategies for reducing cardiovascular risk by attending the [employer] education and nutrition series    Personal Goal #2 Patient to improve diet quality by using the plate method as  a guide for meal planning to including lean protein/plant protein, fruits, vegetables, whole gains and nonfat dairy.    Personal Goal #3 Patient to identify food sources of saturated fat, trans fat, sodium, and refined carbohydrates    Personal Goal #4 Patient to identify strategies for weight loss of 0.5-2.0# per week    Comments Goals in action. Patrick Noble continues to attend the [employer] education series. He has made many dietary changes including increased dietary fiber, reduced saturated fat, and continues to read food labels for sodium and sugar. Patrick Noble will continue to benefit from participation in intensive cardiac rehab for nutrition, exericse, and lifestyle modification.      Intervention Plan   Intervention Prescribe, educate and counsel regarding individualized specific dietary modifications aiming towards targeted core components such as weight, hypertension, lipid management, diabetes, heart failure and other comorbidities.;Nutrition handout(s) given to patient.    Expected Outcomes Short Term Goal: Understand basic principles of dietary content, such as calories, fat, sodium, cholesterol and nutrients.;Long Term Goal: Adherence to prescribed nutrition plan.             Nutrition Discharge:  Nutrition Assessments - 11/16/22 0930       Rate Your Plate Scores   Post Score 81             Education Questionnaire Score:  Knowledge Questionnaire Score - 11/16/22 0913       Knowledge Questionnaire Score    Pre Score 20/24    Post Score 23/24             Goals reviewed with patient; copy given to patient.Pt graduates from  Intensive/Traditional cardiac rehab program on 11/24/22  with completion of  24 exercise and 24  education sessions. Pt maintained good attendance and progressed nicely during their participation in rehab as evidenced by increased MET level.   Medication list reconciled. Repeat  PHQ score-  2.  Pt has made significant lifestyle changes and should be commended for their success. Patrick Noble achieved their goals during cardiac rehab.   Pt plans to continue exercise at the [facility] [employer] and [employer]. Patrick Noble has lost weight and has increased endurance as a result of participating in the program. We are proud of Patrick Noble's progress! Patrick Noble increased his distance on his post exercise walk test by 126 feet. Patrick Noble said that cardiac rehab was beneficial to him.Harrell Gave RN BSN

## 2022-11-25 ENCOUNTER — Encounter: Payer: Self-pay | Admitting: Internal Medicine

## 2022-11-30 DIAGNOSIS — G4752 REM sleep behavior disorder: Secondary | ICD-10-CM | POA: Diagnosis not present

## 2022-11-30 DIAGNOSIS — G4733 Obstructive sleep apnea (adult) (pediatric): Secondary | ICD-10-CM | POA: Diagnosis not present

## 2022-11-30 DIAGNOSIS — I10 Essential (primary) hypertension: Secondary | ICD-10-CM | POA: Diagnosis not present

## 2022-12-06 ENCOUNTER — Other Ambulatory Visit: Payer: Self-pay | Admitting: Internal Medicine

## 2022-12-30 ENCOUNTER — Telehealth: Payer: Self-pay

## 2022-12-30 NOTE — Telephone Encounter (Signed)
[facility] pt yesterday-finished cardiac rehab recently interested in participating in PREP.  Call to pt. Described program to pt. Interested in participating. He is closest to [facility].  He will contact Dr Debara Pickett for order. [place] aware.

## 2023-01-10 DIAGNOSIS — D1723 Benign lipomatous neoplasm of skin and subcutaneous tissue of right leg: Secondary | ICD-10-CM | POA: Diagnosis not present

## 2023-01-11 DIAGNOSIS — H353112 Nonexudative age-related macular degeneration, right eye, intermediate dry stage: Secondary | ICD-10-CM | POA: Diagnosis not present

## 2023-01-11 DIAGNOSIS — H40023 Open angle with borderline findings, high risk, bilateral: Secondary | ICD-10-CM | POA: Diagnosis not present

## 2023-01-11 DIAGNOSIS — H353121 Nonexudative age-related macular degeneration, left eye, early dry stage: Secondary | ICD-10-CM | POA: Diagnosis not present

## 2023-01-11 DIAGNOSIS — H26492 Other secondary cataract, left eye: Secondary | ICD-10-CM | POA: Diagnosis not present

## 2023-01-11 DIAGNOSIS — H524 Presbyopia: Secondary | ICD-10-CM | POA: Diagnosis not present

## 2023-01-14 DIAGNOSIS — F3341 Major depressive disorder, recurrent, in partial remission: Secondary | ICD-10-CM | POA: Diagnosis not present

## 2023-01-14 DIAGNOSIS — R7303 Prediabetes: Secondary | ICD-10-CM | POA: Diagnosis not present

## 2023-01-14 DIAGNOSIS — R809 Proteinuria, unspecified: Secondary | ICD-10-CM | POA: Diagnosis not present

## 2023-01-14 DIAGNOSIS — F419 Anxiety disorder, unspecified: Secondary | ICD-10-CM | POA: Diagnosis not present

## 2023-01-14 DIAGNOSIS — I10 Essential (primary) hypertension: Secondary | ICD-10-CM | POA: Diagnosis not present

## 2023-01-24 ENCOUNTER — Other Ambulatory Visit: Payer: Self-pay

## 2023-01-24 DIAGNOSIS — E785 Hyperlipidemia, unspecified: Secondary | ICD-10-CM | POA: Diagnosis not present

## 2023-01-24 DIAGNOSIS — Z79899 Other long term (current) drug therapy: Secondary | ICD-10-CM | POA: Diagnosis not present

## 2023-01-24 NOTE — Progress Notes (Signed)
New NMR order placed due to issues with previous order.

## 2023-01-25 LAB — NMR, LIPOPROFILE
Cholesterol, Total: 100 mg/dL (ref 100–199)
HDL Particle Number: 27.8 umol/L — ABNORMAL LOW (ref >=30.5)
HDL-C: 33 mg/dL — ABNORMAL LOW (ref >39)
LDL Particle Number: 627 nmol/L (ref <1000)
LDL Size: 20 nm — ABNORMAL LOW (ref >20.5)
LDL-C (NIH Calc): 48 mg/dL (ref 0–99)
LP-IR Score: 44 (ref <=45)
Small LDL Particle Number: 403 nmol/L (ref <=527)
Triglycerides: 96 mg/dL (ref 0–149)

## 2023-01-25 LAB — VITAMIN D 25 HYDROXY (VIT D DEFICIENCY, FRACTURES): Vit D, 25-Hydroxy: 66.2 ng/mL (ref 30.0–100.0)

## 2023-01-25 LAB — LIPOPROTEIN A (LPA): Lipoprotein (a): 31.3 nmol/L (ref <75.0)

## 2023-01-31 ENCOUNTER — Telehealth: Payer: Self-pay

## 2023-01-31 NOTE — Telephone Encounter (Signed)
Called to disucss PREP class schedule at [facility], left voicemail

## 2023-02-03 ENCOUNTER — Ambulatory Visit: Payer: PPO | Attending: Internal Medicine | Admitting: Internal Medicine

## 2023-02-03 ENCOUNTER — Encounter: Payer: Self-pay | Admitting: Internal Medicine

## 2023-02-03 VITALS — BP 120/74 | HR 66 | Ht 76.0 in | Wt 197.0 lb

## 2023-02-03 DIAGNOSIS — E785 Hyperlipidemia, unspecified: Secondary | ICD-10-CM | POA: Diagnosis not present

## 2023-02-03 DIAGNOSIS — I2510 Atherosclerotic heart disease of native coronary artery without angina pectoris: Secondary | ICD-10-CM | POA: Diagnosis not present

## 2023-02-03 DIAGNOSIS — E559 Vitamin D deficiency, unspecified: Secondary | ICD-10-CM | POA: Diagnosis not present

## 2023-02-03 MED ORDER — METOPROLOL SUCCINATE ER 25 MG PO TB24: 12.5000 mg | ORAL_TABLET | Freq: Every day | ORAL | 3 refills | Status: DC

## 2023-02-03 NOTE — Patient Instructions (Signed)
Medication Instructions:  STOP metoprolol tartrate (Lopressor) START metoprolol succinate (Toprol XL) 12.5 mg daily  *If you need a refill on your cardiac medications before your next appointment, please call your pharmacy*  Follow-Up: At [facility], you and your health needs are our priority.  As part of our continuing mission to provide you with exceptional heart care, we have created designated Provider Care Teams.  These Care Teams include your primary Cardiologist (physician) and Advanced Practice Providers (APPs -  Physician Assistants and Nurse Practitioners) who all work together to provide you with the care you need, when you need it.  We recommend signing up for the patient portal called "MyChart".  Sign up information is provided on this After Visit Summary.  MyChart is used to connect with patients for Virtual Visits (Telemedicine).  Patients are able to view lab/test results, encounter notes, upcoming appointments, etc.  Non-urgent messages can be sent to your provider as well.   To learn more about what you can do with MyChart, go to [email or web address].    Your next appointment:   6 month(s)  Provider:   Chrystie Nose, MD

## 2023-02-03 NOTE — Progress Notes (Signed)
OFFICE CONSULT NOTE  Chief Complaint:  Follow-up cath  Primary Care Physician: Catha Gosselin, MD  HPI:  Patrick Penza. is a 72 y.o. male who is being seen today for the evaluation of shortness of breath and chest tightness at the request of Little, Caryn Bee, MD. this is a pleasant 72 year old male whose wife is a patient of mine.  He has recently had worsening shortness of breath and chest tightness.  He reports longstanding history of significant scoliosis, diagnosed at a young age.  He also has a history of hypertension, kidney stones and chronic pain.  He is concerned about his tightness and shortness of breath as it may represent heart disease.  He reports his father had an MI at age 75.  He also was noted to have bilateral carotid artery disease but was a smoker.  Patrick Noble has a remote history of smoking but quit more than 30 years ago.  He previously was on lisinopril for hypertension but says he stopped it and his blood pressures have been normal.  He also used to take Lovaza but is no longer on that.  His most recent recent lipid profile was in October 2022 showing total cholesterol 178, HDL 32, triglycerides 172 and LDL 115.  Renal function was normal.  A1c 5.8%.  EKG today shows normal sinus rhythm at 86 and possible left atrial enlargement with nonspecific ST changes.  07/20/2022  Patrick Noble returns today for follow-up of his heart catheterization.  He underwent CT coronary angiography for symptoms concerning for unstable angina.  CT FFR suggested significant multivessel disease and catheterization was recommended.  He underwent left heart catheterization on 07/01/2022, demonstrating 100% proximal RCA stenosis with left-to-right collaterals.  There was an 80% proximal LAD stenosis and 99% stenosis of a circumflex marginal branch.  He underwent drug-eluting stent placement to both vessels and did well.  It was recommended he remain on aspirin and Plavix dual antiplatelet therapy for 1 year.   Since then he has done well.  He notes some improvement in his shortness of breath and exercise tolerance.  He has not coming echocardiogram on October 20.  No bleeding issues on dual antiplatelet therapy.  He has not had a recent repeat lipid profile although he did just see his primary care provider.  EKG today is normal.  02/02/2023  Patrick Noble returns today for follow-up.  Overall he is doing well.  He has had no further chest pain.  He gets some occasional upper back pain when exercising.  He is lost a good bit of weight.  He completed the cardiac rehab program and did very well with the special diet program.  He is a little frustrated that he did not have an improvement in A1c which was 6.2%.  Previously it was 5.8%.  His repeat labs however are outstanding.  His LP(a) was negative.  Vitamin D has come up from 24-66.2.  His LDL particle number was 627, LDL-C of 48, HDL-C was 33 and triglycerides 96.  He is still on dual antiplatelet therapy with a plan to stop clopidogrel in October.  He was having some issues with dizziness which I felt might have been related to low blood pressure with his weight loss.  He backed off on his beta-blocker but currently is only taking 12 and half milligrams of metoprolol tartrate once a day.  PMHx:  Past Medical History:  Diagnosis Date   Coronary artery disease    Degenerative arthritis of lumbar  spine    Hyperlipidemia    Hypertension    Kidney stone    Prostate pain    Scoliosis     Past Surgical History:  Procedure Laterality Date   CARDIAC CATHETERIZATION     CHOLECYSTECTOMY     CORONARY STENT INTERVENTION N/A 07/01/2022   Procedure: CORONARY STENT INTERVENTION;  Surgeon: Kathleene Hazel, MD;  [facility];  Service: Cardiovascular;  Laterality: N/A;   injections in the back     KNEE ARTHROSCOPY     LEFT HEART CATH AND CORONARY ANGIOGRAPHY N/A 07/01/2022   Procedure: LEFT HEART CATH AND CORONARY ANGIOGRAPHY;  Surgeon:  Kathleene Hazel, MD;  Location: [facility];  Service: Cardiovascular;  Laterality: N/A;    FAMHx:  Family History  Problem Relation Age of Onset   Cancer Mother    Cancer Father    Heart attack Father    Neuropathy Father     SOCHx:   reports that he has quit smoking. He has never used smokeless tobacco. He reports current alcohol use. He reports that he does not use drugs.  ALLERGIES:  Allergies  Allergen Reactions   Prozac [Fluoxetine Hcl] Anxiety    ROS: Pertinent items noted in HPI and remainder of comprehensive ROS otherwise negative.  HOME MEDS: Current Outpatient Medications on File Prior to Visit  Medication Sig Dispense Refill   acetaminophen (TYLENOL) 500 MG tablet Take 1,000 mg by mouth every 6 (six) hours as needed for moderate pain.     aspirin EC 81 MG tablet Take 1 tablet (81 mg total) by mouth daily. Swallow whole. 90 tablet 3   atorvastatin (LIPITOR) 80 MG tablet Take 1 tablet (80 mg total) by mouth daily. 90 tablet 3   Cholecalciferol (DIALYVITE VITAMIN D 5000) 125 MCG (5000 UT) capsule Take 5,000 Units by mouth daily.     clonazePAM (KLONOPIN) 0.5 MG tablet Take 0.25 mg by mouth at bedtime.     clopidogrel (PLAVIX) 75 MG tablet Take 1 tablet (75 mg total) by mouth daily. 90 tablet 3   fluticasone (FLONASE) 50 MCG/ACT nasal spray Place 1 spray into both nostrils daily as needed for allergies.     HYDROcodone-acetaminophen (NORCO/VICODIN) 5-325 MG tablet Take 0.5-1 tablets by mouth every 6 (six) hours as needed for moderate pain.      lisinopril (ZESTRIL) 2.5 MG tablet Take 2.5 mg by mouth daily.     metoprolol tartrate (LOPRESSOR) 25 MG tablet Take 0.5 tablets (12.5 mg total) by mouth 2 (two) times daily. (Patient taking differently: Take 12.5 mg by mouth daily. Ree Kida said it was decreased to once a day by Dr Rennis Golden) 90 tablet 3   Multiple Vitamins-Minerals (MULTIVITAMIN WITH MINERALS) tablet Take 1 tablet by mouth daily.     Multiple  Vitamins-Minerals (PRESERVISION AREDS 2 PO) Take 1 capsule by mouth 2 (two) times daily.     nitroGLYCERIN (NITROSTAT) 0.4 MG SL tablet Place 1 tablet (0.4 mg total) under the tongue every 5 (five) minutes as needed for chest pain. 30 tablet 2   PARoxetine (PAXIL) 40 MG tablet Take 40 mg by mouth at bedtime.      Wheat Dextrin (BENEFIBER DRINK MIX PO) Take 1 Scoop by mouth daily.     Vitamin D, Ergocalciferol, (DRISDOL) 1.25 MG (50000 UNIT) CAPS capsule Take 1 capsule (50,000 Units total) by mouth every 7 (seven) days. Take for 4 weeks then start OTC vitamin D 1000 IU daily (Patient not taking: Reported on 02/03/2023)  4 capsule 0   No current facility-administered medications on file prior to visit.    LABS/IMAGING: No results found for this or any previous visit (from the past 48 hour(s)). No results found.  LIPID PANEL: No results found for: "CHOL", "TRIG", "HDL", "CHOLHDL", "VLDL", "LDLCALC", "LDLDIRECT"  WEIGHTS: Wt Readings from Last 3 Encounters:  02/03/23 197 lb (89.4 kg)  11/24/22 200 lb 13.4 oz (91.1 kg)  09/21/22 208 lb 15.9 oz (94.8 kg)    VITALS: BP 120/74 (BP Location: Left Arm, Patient Position: Sitting, Cuff Size: Large)   Pulse 66   Ht  (1.93 m)   Wt 197 lb (89.4 kg)   SpO2 99%   BMI 23.98 kg/m   EXAM: General appearance: alert and no distress Neck: no carotid bruit, no JVD, thyroid not enlarged, symmetric, no tenderness/mass/nodules, and right earlobe crease Lungs: clear to auscultation bilaterally Heart: regular rate and rhythm, S1, S2 normal, no murmur, click, rub or gallop Abdomen: soft, non-tender; bowel sounds normal; no masses,  no organomegaly Extremities: extremities normal, atraumatic, no cyanosis or edema and marked thoracic scoliosis Pulses: 2+ and symmetric Skin: Skin color, texture, turgor normal. No rashes or lesions Neurologic: Grossly normal Psych: Pleasant  EKG: Deferred  ASSESSMENT: CAD status post two-vessel PCI/DES to the LAD  and marginal branch with proximal RCA occlusion and left-to-right collaterals (06/2022) Progressive dyspnea on exertion and chest tightness History of premature coronary artery disease in his father History of hypertension-resolved Remote smoking history Vitamin D deficiency Dyslipidemia, goal LDL <70  PLAN: 1.   Patrick Noble continues to do well close to a year out from his stenting.  Will plan to stop his clopidogrel and October.  His blood pressure is now very well-controlled.  We have cut back on his metoprolol and we will switch him to Toprol XL 12.5 mg daily.  His vitamin D deficiency has resolved.  His lipids are well-controlled.  Plan follow-up with me in 6 months or sooner as necessary.  Chrystie Nose, MD, [facility], FACP  [place]  [facility]  Medical Director of the [facility] Lipid Disorders &  Cardiovascular Risk Reduction Clinic Diplomate of the American Board of Clinical Lipidology Attending Cardiologist  Direct Dial: [phone]  Fax: [phone]  Website:  [email or web address] 02/03/2023, 8:53 AM

## 2023-02-08 ENCOUNTER — Telehealth: Payer: Self-pay

## 2023-02-08 NOTE — Telephone Encounter (Signed)
Returned his call, has completed cardiac rehab, is interested in attending PREP at [facility]; have him on the waitlist for May class, if no availability will start in June 3 class; need referral from Dr. Rennis Golden; will contact in May to confirm which class is open.

## 2023-02-14 ENCOUNTER — Telehealth: Payer: Self-pay

## 2023-02-14 NOTE — Telephone Encounter (Signed)
Called to offer availability in next PREP class at [facility] on May 14, left voicemail requesting return call.

## 2023-02-18 ENCOUNTER — Telehealth: Payer: Self-pay

## 2023-02-18 NOTE — Telephone Encounter (Signed)
Told me he would not be able to start in PREP class on 5/14, have offered either June 3 or June 18 class; asked him to let me know which he prefers.

## 2023-03-03 ENCOUNTER — Telehealth: Payer: Self-pay

## 2023-03-03 NOTE — Telephone Encounter (Signed)
Called to discuss PREP class schedule, he would like to attend the June 18 class at [facility], every T/Th 10-11:15; will contact first week of June to schedule assessment visit.

## 2023-03-16 ENCOUNTER — Telehealth: Payer: Self-pay

## 2023-03-16 NOTE — Telephone Encounter (Signed)
Called to confirm participation in PREP class at [facility] on June 18, every T/Th 10-11:15; assessment visit scheduled for 6/12 at 2pm

## 2023-03-23 NOTE — Progress Notes (Signed)
[facility] PREP Evaluation  Patient Details  Name: Patrick Noble. MRN: [id number] Date of Birth: Jul 05, 1951 Age: 72 y.o. PCP: Catha Gosselin, MD  Vitals:   03/23/23 1506  BP: 112/70  Pulse: 62  SpO2: 99%  Weight: 197 lb 6.4 oz (89.5 kg)     [facility] Eval - 03/23/23 1500       [facility] "PREP" Location   [facility] "PREP" Location [name]      Referral    Referring Provider Hilty/Cardiac Rehab    Reason for referral High Cholesterol;Hypertension    Program Start Date 03/29/23      Measurement   Waist Circumference 38.5 inches    Hip Circumference 41.5 inches    Body fat 24.5 percent      Information for Trainer   Goals --   Increase stamina; establish strength trainiing routine/program   Orthopedic Concerns --   back/knee pain   Pertinent Medical History --   scoliosis, high cholesterol; s/p PTCA '23; HTN, OSA   Medications that affect exercise Beta blocker      Timed Up and Go (TUGS)   Timed Up and Go Low risk <9 seconds      Mobility and Daily Activities   I find it easy to walk up or down two or more flights of stairs. 3    I have no trouble taking out the trash. 4    I do housework such as vacuuming and dusting on my own without difficulty. 4    I can easily lift a gallon of milk (8lbs). 4    I can easily walk a mile. 4    I have no trouble reaching into high cupboards or reaching down to pick up something from the floor. 4    I do not have trouble doing out-door work such as Loss adjuster, chartered, raking leaves, or gardening. 4      Mobility and Daily Activities   I feel younger than my age. 2    I feel independent. 4    I feel energetic. 1    I live an active life.  2    I feel strong. 3    I feel healthy. 2    I feel active as other people my age. 2      How fit and strong are you.   Fit and Strong Total Score 43            Past Medical History:  Diagnosis Date   Coronary artery disease    Degenerative arthritis of lumbar spine    Hyperlipidemia     Hypertension    Kidney stone    Prostate pain    Scoliosis    Past Surgical History:  Procedure Laterality Date   CARDIAC CATHETERIZATION     CHOLECYSTECTOMY     CORONARY STENT INTERVENTION N/A 07/01/2022   Procedure: CORONARY STENT INTERVENTION;  Surgeon: Kathleene Hazel, MD;  [facility];  Service: Cardiovascular;  Laterality: N/A;   injections in the back     KNEE ARTHROSCOPY     LEFT HEART CATH AND CORONARY ANGIOGRAPHY N/A 07/01/2022   Procedure: LEFT HEART CATH AND CORONARY ANGIOGRAPHY;  Surgeon: Kathleene Hazel, MD;  [facility];  Service: Cardiovascular;  Laterality: N/A;   Social History   Tobacco Use  Smoking Status Former  Smokeless Tobacco Never  To begin PREP class at [facility] on June 18, every T/Th 10-11:15  Desia Saban B Degan Hanser  03/23/2023, 3:10 PM

## 2023-03-28 ENCOUNTER — Other Ambulatory Visit: Payer: Self-pay | Admitting: Cardiovascular Disease

## 2023-03-29 NOTE — Progress Notes (Signed)
[facility] PREP Weekly Session  Patient Details  Name: Patrick Noble. MRN: [id number] Date of Birth: Mar 27, 1951 Age: 72 y.o. PCP: Catha Gosselin, MD  There were no vitals filed for this visit.   [facility] Weekly seesion - 03/29/23 1100       [facility] "PREP" Location   [facility] "PREP" Location [name]      Weekly Session   Topic Discussed Goal setting and welcome to the program   introductions, review of notebook tour of facility; offered short intro to cardio machine w/option exercise   Classes attended to date 1             Laron Angelini B Cecila Satcher 03/29/2023, 11:38 AM

## 2023-04-05 NOTE — Progress Notes (Signed)
[facility] PREP Weekly Session  Patient Details  Name: Patrick Noble. MRN: [id number] Date of Birth: [birth year] Age: 72 y.o. PCP: Catha Gosselin, MD  Vitals:   04/05/23 1143  Weight: 193 lb (87.5 kg)     [facility] Weekly seesion - 04/05/23 1100       [facility] "PREP" Location   [facility] "PREP" Location [name]      Weekly Session   Topic Discussed Importance of resistance training;Other ways to be active   Cardio goal: work up to 150 minutes/wk; resistance training: work up to 2-3 times/wk for 20-40 minutes   Minutes exercised this week 75 minutes    Classes attended to date 3             Patrick Noble Patrick Noble 04/05/2023, 11:44 AM

## 2023-04-06 DIAGNOSIS — G4733 Obstructive sleep apnea (adult) (pediatric): Secondary | ICD-10-CM | POA: Diagnosis not present

## 2023-04-12 NOTE — Progress Notes (Signed)
[facility] PREP Weekly Session  Patient Details  Name: Patrick Noble. MRN: [id number] Date of Birth: 03-31-1951 Age: 72 y.o. PCP: Catha Gosselin, MD  Vitals:   04/12/23 1131  Weight: 196 lb (88.9 kg)     [facility] Weekly seesion - 04/12/23 1100       [facility] "PREP" Location   [facility] "PREP" Location [name]      Weekly Session   Topic Discussed Healthy eating tips   Foods to increase, foods to reduce; introduced YUKA app, eat the rainbow of colors   Classes attended to date 4             Patrick Noble 04/12/2023, 11:32 AM

## 2023-04-19 NOTE — Progress Notes (Signed)
[facility] PREP Weekly Session  Patient Details  Name: Patrick Noble. MRN: [id number] Date of Birth: Jan 05, 1951 Age: 72 y.o. PCP: Catha Gosselin, MD  Vitals:   04/19/23 1136  Weight: 197 lb (89.4 kg)     [facility] Weekly seesion - 04/19/23 1100       [facility] "PREP" Location   [facility] "PREP" Location [name]      Weekly Session   Topic Discussed Health habits   Limit added sugars to 24 gms/women, 36 gms/men per day; Sugar demo   Minutes exercised this week 80 minutes    Classes attended to date 5             Alnita Aybar B Brittanni Cariker 04/19/2023, 11:37 AM

## 2023-04-26 NOTE — Progress Notes (Signed)
[facility] PREP Weekly Session  Patient Details  Name: Davian Hanshaw. MRN: [id number] Date of Birth: November 21, 1950 Age: 72 y.o. PCP: Catha Gosselin, MD  Vitals:   04/26/23 1110  Weight: 197 lb (89.4 kg)     [facility] Weekly seesion - 04/26/23 1100       [facility] "PREP" Location   [facility] "PREP" Location [name]      Weekly Session   Topic Discussed Restaurant Eating   Limit Na intake to 1500-2300 mg/day; salt demo   Minutes exercised this week 65 minutes    Classes attended to date 7             Heather Mckendree B Tyse Auriemma 04/26/2023, 11:11 AM

## 2023-05-03 NOTE — Progress Notes (Signed)
[facility] PREP Weekly Session  Patient Details  Name: Patrick Noble. MRN: [id number] Date of Birth: 07-12-1951 Age: 72 y.o. PCP: Catha Gosselin, MD  Vitals:   05/03/23 1104  Weight: 195 lb 6.4 oz (88.6 kg)     [facility] Weekly seesion - 05/03/23 1100       [facility] "PREP" Location   [facility] "PREP" Location [name]      Weekly Session   Topic Discussed Stress management and problem solving   Finger tip mudra breathwork; importance of sleep: 7-9 hrs/night   Minutes exercised this week 95 minutes    Classes attended to date 11             Patrick Noble 05/03/2023, 11:05 AM

## 2023-05-10 NOTE — Progress Notes (Signed)
[facility] PREP Weekly Session  Patient Details  Name: Patrick Noble. MRN: [id number] Date of Birth: 1951-05-07 Age: 72 y.o. PCP: Catha Gosselin, MD  Vitals:   05/10/23 1059  Weight: 198 lb (89.8 kg)     [facility] Weekly seesion - 05/10/23 1000       [facility] "PREP" Location   [facility] "PREP" Location [name]      Weekly Session   Topic Discussed Expectations and non-scale victories   Halfway thru program---review, revisit, re-state goals; staying positive   Minutes exercised this week 140 minutes    Classes attended to date 50             Patrick Noble 05/10/2023, 11:00 AM

## 2023-05-16 NOTE — Progress Notes (Signed)
[facility] PREP Weekly Session  Patient Details  Name: Patrick Noble. MRN: [id number] Date of Birth: 1950-10-17 Age: 72 y.o. PCP: Catha Gosselin, MD  Vitals:   05/16/23 1505  Weight: 191 lb (86.6 kg)     [facility] Weekly seesion - 05/16/23 1500       [facility] "PREP" Location   [facility] "PREP" Location [name]      Weekly Session   Topic Discussed Other   Portion control, visualize your portion size demo; review of Red, Sugar Craisin food label.   Minutes exercised this week 110 minutes    Classes attended to date 64              B  05/16/2023, 3:06 PM

## 2023-05-17 DIAGNOSIS — I10 Essential (primary) hypertension: Secondary | ICD-10-CM | POA: Diagnosis not present

## 2023-05-17 DIAGNOSIS — G4733 Obstructive sleep apnea (adult) (pediatric): Secondary | ICD-10-CM | POA: Diagnosis not present

## 2023-05-17 DIAGNOSIS — G4752 REM sleep behavior disorder: Secondary | ICD-10-CM | POA: Diagnosis not present

## 2023-06-15 ENCOUNTER — Ambulatory Visit (HOSPITAL_COMMUNITY)
Admission: RE | Admit: 2023-06-15 | Discharge: 2023-06-15 | Disposition: A | Discharge status: Home / Self Care | Payer: PPO | Source: Ambulatory Visit | Attending: Cardiovascular Disease | Admitting: Cardiovascular Disease

## 2023-06-15 DIAGNOSIS — I6521 Occlusion and stenosis of right carotid artery: Secondary | ICD-10-CM | POA: Insufficient documentation

## 2023-06-16 ENCOUNTER — Other Ambulatory Visit: Payer: Self-pay | Admitting: Internal Medicine

## 2023-06-16 ENCOUNTER — Other Ambulatory Visit: Payer: Self-pay | Admitting: Physician Assistant

## 2023-06-16 DIAGNOSIS — I6523 Occlusion and stenosis of bilateral carotid arteries: Secondary | ICD-10-CM

## 2023-07-19 DIAGNOSIS — M4124 Other idiopathic scoliosis, thoracic region: Secondary | ICD-10-CM | POA: Diagnosis not present

## 2023-07-19 DIAGNOSIS — M5440 Lumbago with sciatica, unspecified side: Secondary | ICD-10-CM | POA: Diagnosis not present

## 2023-07-20 DIAGNOSIS — R7303 Prediabetes: Secondary | ICD-10-CM | POA: Diagnosis not present

## 2023-07-20 DIAGNOSIS — F3341 Major depressive disorder, recurrent, in partial remission: Secondary | ICD-10-CM | POA: Diagnosis not present

## 2023-07-20 DIAGNOSIS — I10 Essential (primary) hypertension: Secondary | ICD-10-CM | POA: Diagnosis not present

## 2023-07-20 DIAGNOSIS — D649 Anemia, unspecified: Secondary | ICD-10-CM | POA: Diagnosis not present

## 2023-07-20 DIAGNOSIS — K219 Gastro-esophageal reflux disease without esophagitis: Secondary | ICD-10-CM | POA: Diagnosis not present

## 2023-07-20 DIAGNOSIS — R809 Proteinuria, unspecified: Secondary | ICD-10-CM | POA: Diagnosis not present

## 2023-07-20 DIAGNOSIS — D72829 Elevated white blood cell count, unspecified: Secondary | ICD-10-CM | POA: Diagnosis not present

## 2023-07-20 DIAGNOSIS — Z125 Encounter for screening for malignant neoplasm of prostate: Secondary | ICD-10-CM | POA: Diagnosis not present

## 2023-07-20 DIAGNOSIS — M5030 Other cervical disc degeneration, unspecified cervical region: Secondary | ICD-10-CM | POA: Diagnosis not present

## 2023-07-20 DIAGNOSIS — Z136 Encounter for screening for cardiovascular disorders: Secondary | ICD-10-CM | POA: Diagnosis not present

## 2023-07-20 DIAGNOSIS — Z0000 Encounter for general adult medical examination without abnormal findings: Secondary | ICD-10-CM | POA: Diagnosis not present

## 2023-07-20 DIAGNOSIS — C61 Malignant neoplasm of prostate: Secondary | ICD-10-CM | POA: Diagnosis not present

## 2023-07-20 DIAGNOSIS — M549 Dorsalgia, unspecified: Secondary | ICD-10-CM | POA: Diagnosis not present

## 2023-07-20 DIAGNOSIS — Z23 Encounter for immunization: Secondary | ICD-10-CM | POA: Diagnosis not present

## 2023-07-20 DIAGNOSIS — E559 Vitamin D deficiency, unspecified: Secondary | ICD-10-CM | POA: Diagnosis not present

## 2023-07-27 DIAGNOSIS — M5440 Lumbago with sciatica, unspecified side: Secondary | ICD-10-CM | POA: Diagnosis not present

## 2023-07-27 DIAGNOSIS — M4124 Other idiopathic scoliosis, thoracic region: Secondary | ICD-10-CM | POA: Diagnosis not present

## 2023-08-02 ENCOUNTER — Encounter: Payer: Self-pay | Admitting: Physician Assistant

## 2023-08-02 ENCOUNTER — Ambulatory Visit: Payer: PPO | Attending: Physician Assistant | Admitting: Physician Assistant

## 2023-08-02 VITALS — BP 128/68 | Ht 73.0 in | Wt 194.2 lb

## 2023-08-02 DIAGNOSIS — I200 Unstable angina: Secondary | ICD-10-CM

## 2023-08-02 DIAGNOSIS — Z136 Encounter for screening for cardiovascular disorders: Secondary | ICD-10-CM

## 2023-08-02 DIAGNOSIS — I2510 Atherosclerotic heart disease of native coronary artery without angina pectoris: Secondary | ICD-10-CM

## 2023-08-02 DIAGNOSIS — E785 Hyperlipidemia, unspecified: Secondary | ICD-10-CM | POA: Diagnosis not present

## 2023-08-02 NOTE — Progress Notes (Unsigned)
Cardiology Office Note:  .   Date:  08/02/2023  ID:  Patrick Estelle., DOB [birth year], MRN [id number] PCP: Catha Gosselin, MD  [facility] Providers Cardiologist:  Chrystie Nose, MD { Click to update primary MD,subspecialty MD or APP then REFRESH:1}   History of Present Illness: .   Patrick Noble. is a 72 y.o. male with a hx of remote tobacco use, hypertension (not on meds), low back pain and history of scoliosis.  Patient was previously on lisinopril, however this medication was later stopped after blood pressure normalized.  He was also on Lovaza in the past however no longer on it.  Patient was referred by his PCP to see Dr. Rennis Golden on 06/09/2022 for evaluation of shortness of breath with exertion and also chest discomfort.  Blood pressure was normal on that day.  Blood work obtained on the same day showed a creatinine 1.14.  Nonfasting glucose was 233.  Vitamin D was low, he was placed on vitamin D supplement.  Coronary CT obtained on 06/28/2022 showed 0 to 24% left main disease, 50 to 69% proximal LAD lesion, 25 to 49% mid LAD lesion, 0 to 24% D1 lesion, 0 to 24% proximal left circumflex lesion, 70 to 99% mid left circumflex lesion.  Subsequent FFR showed hemodynamically significant lesion in the proximal LAD, mid left circumflex artery and CTO of the proximal RCA.  The results are suggestive of triple-vessel disease.  Cardiac catheterization was recommended.  Subsequent cardiac catheterization performed on 07/01/2022 showed 100% proximal RCA lesion, 99% OM2 lesion, 80% proximal LAD lesion, 50% mid LAD lesion.  Patient received 1 drug-eluting stent to the proximal LAD and another DES to OM1 extending into the AV groove left circumflex artery.  CTO proximal RCA had distal collaterals from left to right.  Patient was placed on aspirin and Plavix after the procedure.  He was most recently seen by Dr. Rennis Golden in April 2024 at which time he was doing well.  Patient presents today for follow-up.  He has  been doing well without any exertional chest pain or shortness of breath.  Since he is 1 year out from his last PCI, he is okay to stop the Plavix.  We will remove the Plavix from his medication list.  Otherwise, he does not have any lower extremity edema, orthopnea or PND.  Recent blood work shows his cholesterol is very well-controlled.  Hemoglobin A1c is still 6.2.  He is watching his diet.  He can follow-up with Dr. Rennis Golden in 46-month.  ROS: ***  Studies Reviewed: .        *** Risk Assessment/Calculations:   {Does this patient have ATRIAL FIBRILLATION?:[id number]}         Physical Exam:   VS:  BP 128/68 (BP Location: Left Arm, Patient Position: Sitting, Cuff Size: Normal)   Ht 6\' 1"  (1.854 m)   Wt 194 lb 3.2 oz (88.1 kg)   BMI 25.62 kg/m    Wt Readings from Last 3 Encounters:  08/02/23 194 lb 3.2 oz (88.1 kg)  05/16/23 191 lb (86.6 kg)  05/10/23 198 lb (89.8 kg)    GEN: Well nourished, well developed in no acute distress NECK: No JVD; No carotid bruits CARDIAC: ***RRR, no murmurs, rubs, gallops RESPIRATORY:  Clear to auscultation without rales, wheezing or rhonchi  ABDOMEN: Soft, non-tender, non-distended EXTREMITIES:  No edema; No deformity   ASSESSMENT AND PLAN: .   ***    {Are you ordering a CV Procedure (e.g. stress  test, cath, DCCV, TEE, etc)?   Press F2        :[id number]}  Dispo: ***  Signed, Azalee Course, PA

## 2023-08-02 NOTE — Patient Instructions (Signed)
Medication Instructions:  STOP PLAVIX.  *If you need a refill on your cardiac medications before your next appointment, please call your pharmacy*   Lab Work: NO LABS If you have labs (blood work) drawn today and your tests are completely normal, you will receive your results only by: MyChart Message (if you have MyChart) OR A paper copy in the mail If you have any lab test that is abnormal or we need to change your treatment, we will call you to review the results.   Testing/Procedures: NO TESTING   Follow-Up: At [facility], you and your health needs are our priority.  As part of our continuing mission to provide you with exceptional heart care, we have created designated Provider Care Teams.  These Care Teams include your primary Cardiologist (physician) and Advanced Practice Providers (APPs -  Physician Assistants and Nurse Practitioners) who all work together to provide you with the care you need, when you need it.  Your next appointment:   6 month(s)  Provider:   Chrystie Nose, MD

## 2023-08-03 DIAGNOSIS — M4124 Other idiopathic scoliosis, thoracic region: Secondary | ICD-10-CM | POA: Diagnosis not present

## 2023-08-03 DIAGNOSIS — M5440 Lumbago with sciatica, unspecified side: Secondary | ICD-10-CM | POA: Diagnosis not present

## 2023-08-09 DIAGNOSIS — M4124 Other idiopathic scoliosis, thoracic region: Secondary | ICD-10-CM | POA: Diagnosis not present

## 2023-08-09 DIAGNOSIS — M5440 Lumbago with sciatica, unspecified side: Secondary | ICD-10-CM | POA: Diagnosis not present

## 2023-08-16 DIAGNOSIS — M5440 Lumbago with sciatica, unspecified side: Secondary | ICD-10-CM | POA: Diagnosis not present

## 2023-08-16 DIAGNOSIS — M4124 Other idiopathic scoliosis, thoracic region: Secondary | ICD-10-CM | POA: Diagnosis not present

## 2023-08-23 DIAGNOSIS — M5440 Lumbago with sciatica, unspecified side: Secondary | ICD-10-CM | POA: Diagnosis not present

## 2023-08-23 DIAGNOSIS — M4124 Other idiopathic scoliosis, thoracic region: Secondary | ICD-10-CM | POA: Diagnosis not present

## 2023-08-29 DIAGNOSIS — C61 Malignant neoplasm of prostate: Secondary | ICD-10-CM | POA: Diagnosis not present

## 2023-08-29 DIAGNOSIS — N200 Calculus of kidney: Secondary | ICD-10-CM | POA: Diagnosis not present

## 2023-09-06 DIAGNOSIS — M5440 Lumbago with sciatica, unspecified side: Secondary | ICD-10-CM | POA: Diagnosis not present

## 2023-09-06 DIAGNOSIS — M4124 Other idiopathic scoliosis, thoracic region: Secondary | ICD-10-CM | POA: Diagnosis not present

## 2023-09-13 DIAGNOSIS — M5440 Lumbago with sciatica, unspecified side: Secondary | ICD-10-CM | POA: Diagnosis not present

## 2023-09-13 DIAGNOSIS — M4124 Other idiopathic scoliosis, thoracic region: Secondary | ICD-10-CM | POA: Diagnosis not present

## 2023-09-21 DIAGNOSIS — M4124 Other idiopathic scoliosis, thoracic region: Secondary | ICD-10-CM | POA: Diagnosis not present

## 2023-09-21 DIAGNOSIS — M5440 Lumbago with sciatica, unspecified side: Secondary | ICD-10-CM | POA: Diagnosis not present

## 2023-09-28 DIAGNOSIS — M5440 Lumbago with sciatica, unspecified side: Secondary | ICD-10-CM | POA: Diagnosis not present

## 2023-09-28 DIAGNOSIS — M4124 Other idiopathic scoliosis, thoracic region: Secondary | ICD-10-CM | POA: Diagnosis not present

## 2023-10-11 DIAGNOSIS — M4124 Other idiopathic scoliosis, thoracic region: Secondary | ICD-10-CM | POA: Diagnosis not present

## 2023-10-11 DIAGNOSIS — M5440 Lumbago with sciatica, unspecified side: Secondary | ICD-10-CM | POA: Diagnosis not present

## 2023-11-29 ENCOUNTER — Emergency Department (HOSPITAL_BASED_OUTPATIENT_CLINIC_OR_DEPARTMENT_OTHER)
Admission: EM | Admit: 2023-11-29 | Discharge: 2023-11-29 | Disposition: A | Discharge status: Home / Self Care | Payer: PPO | Attending: Emergency Medicine | Admitting: Emergency Medicine

## 2023-11-29 ENCOUNTER — Encounter (HOSPITAL_BASED_OUTPATIENT_CLINIC_OR_DEPARTMENT_OTHER): Payer: Self-pay | Admitting: Emergency Medicine

## 2023-11-29 ENCOUNTER — Telehealth: Payer: Self-pay | Admitting: Internal Medicine

## 2023-11-29 ENCOUNTER — Emergency Department (HOSPITAL_BASED_OUTPATIENT_CLINIC_OR_DEPARTMENT_OTHER): Payer: PPO | Admitting: Radiology

## 2023-11-29 ENCOUNTER — Other Ambulatory Visit: Payer: Self-pay

## 2023-11-29 ENCOUNTER — Emergency Department (HOSPITAL_BASED_OUTPATIENT_CLINIC_OR_DEPARTMENT_OTHER): Payer: PPO

## 2023-11-29 DIAGNOSIS — M549 Dorsalgia, unspecified: Secondary | ICD-10-CM | POA: Diagnosis not present

## 2023-11-29 DIAGNOSIS — Z7982 Long term (current) use of aspirin: Secondary | ICD-10-CM | POA: Insufficient documentation

## 2023-11-29 DIAGNOSIS — R0789 Other chest pain: Secondary | ICD-10-CM | POA: Insufficient documentation

## 2023-11-29 DIAGNOSIS — I10 Essential (primary) hypertension: Secondary | ICD-10-CM | POA: Insufficient documentation

## 2023-11-29 DIAGNOSIS — Z79899 Other long term (current) drug therapy: Secondary | ICD-10-CM | POA: Insufficient documentation

## 2023-11-29 DIAGNOSIS — I2510 Atherosclerotic heart disease of native coronary artery without angina pectoris: Secondary | ICD-10-CM | POA: Insufficient documentation

## 2023-11-29 DIAGNOSIS — R079 Chest pain, unspecified: Secondary | ICD-10-CM

## 2023-11-29 LAB — TROPONIN I (HIGH SENSITIVITY)
Troponin I (High Sensitivity): 3 ng/L (ref <18)
Troponin I (High Sensitivity): 3 ng/L (ref <18)

## 2023-11-29 LAB — BASIC METABOLIC PANEL
Anion gap: 4 — ABNORMAL LOW (ref 5–15)
BUN: 23 mg/dL (ref 8–23)
CO2: 30 mmol/L (ref 22–32)
Calcium: 9.7 mg/dL (ref 8.9–10.3)
Chloride: 104 mmol/L (ref 98–111)
Creatinine, Ser: 1.12 mg/dL (ref 0.61–1.24)
GFR, Estimated: >60 mL/min (ref >60)
Glucose, Bld: 185 mg/dL — ABNORMAL HIGH (ref 70–99)
Potassium: 3.8 mmol/L (ref 3.5–5.1)
Sodium: 138 mmol/L (ref 135–145)

## 2023-11-29 LAB — CBC
HCT: 39.6 % (ref 39.0–52.0)
Hemoglobin: 12.2 g/dL — ABNORMAL LOW (ref 13.0–17.0)
MCH: 21 pg — ABNORMAL LOW (ref 26.0–34.0)
MCHC: 30.8 g/dL (ref 30.0–36.0)
MCV: 68.3 fL — ABNORMAL LOW (ref 80.0–100.0)
Platelets: 239 10*3/uL (ref 150–400)
RBC: 5.8 MIL/uL (ref 4.22–5.81)
RDW: 15.1 % (ref 11.5–15.5)
WBC: 9.7 10*3/uL (ref 4.0–10.5)
nRBC: 0 % (ref 0.0–0.2)

## 2023-11-29 NOTE — Discharge Instructions (Signed)
 As we discussed, your troponins are negative today.  Please continue your current meds  If you have worsening chest pain you need to follow-up with Dr. Rennis Golden  Return to ER if you have worse chest pain or shortness of breath

## 2023-11-29 NOTE — Telephone Encounter (Signed)
 Attempted to call patient, no answer left message requesting a call back.

## 2023-11-29 NOTE — Telephone Encounter (Signed)
  Per MyChart scheduling message:  Initial complaint: Tightness in chest and upper middle backHad 2 stents put in 1 1/2 yrs ago just want to check in   Pt c/o of Chest Pain: STAT if active CP, including tightness, pressure, jaw pain, radiating pain to shoulder/upper arm/back, CP unrelieved by Nitro. Symptoms reported of SOB, nausea, vomiting, sweating.  1. Are you having CP right now?    2. Are you experiencing any other symptoms (ex. SOB, nausea, vomiting, sweating)?   3. Is your CP continuous or coming and going?   4. Have you taken Nitroglycerin?   5. How long have you been experiencing CP?    6. If NO CP at time of call then end call with telling Pt to call back or call 911 if Chest pain returns prior to return call from triage team.   No sweating no vomiting tightness comes and goes Been bothersome for a couple days pulse 67 resting As high as 94 walking

## 2023-11-29 NOTE — ED Notes (Signed)
 ED Provider at bedside.

## 2023-11-29 NOTE — ED Triage Notes (Signed)
 C/o chest heaviness x 3 days. Rads into upper back. @ stents approx 1 year ago. Denies SHOB.

## 2023-11-29 NOTE — Telephone Encounter (Signed)
  Patient sent a message through MyChart to scheduling stating they missed the call to please try again.

## 2023-11-29 NOTE — ED Provider Notes (Signed)
 [place] EMERGENCY DEPARTMENT AT [facility] Provider Note   CSN: [id number] Arrival date & time: 11/29/23  1548     History  Chief Complaint  Patient presents with   Chest Pain    Patrick Noble. is a 73 y.o. male history of CAD status post stent, hypertension, here presenting with chest pain.  Patient states that he follows with Dr. Rennis Golden and had a stent about a year ago.  He states that he has intermittent left-sided back pain and chest pain for several days now.  He states that it is occasional and sometimes happens at rest but never with exertion.  He denies any shortness of breath.  He states that he wants to just get it checked out.  He has history of scoliosis and he thought maybe it is related to his scoliosis rather than his heart.  The history is provided by the patient.       Home Medications Prior to Admission medications   Medication Sig Start Date End Date Taking? Authorizing Provider  acetaminophen (TYLENOL) 500 MG tablet Take 1,000 mg by mouth every 6 (six) hours as needed for moderate pain.    [provider]  aspirin EC 81 MG tablet Take 1 tablet (81 mg total) by mouth daily. Swallow whole. 06/30/22   Azalee Course, PA  atorvastatin (LIPITOR) 80 MG tablet TAKE 1 TABLET BY MOUTH EVERY DAY 06/16/23   Azalee Course, PA  Cholecalciferol (DIALYVITE VITAMIN D 5000) 125 MCG (5000 UT) capsule Take 5,000 Units by mouth daily.    [provider]  Cholecalciferol 50 MCG (2000 UT) TABS Take 5,000 Units by mouth daily. Patient not taking: Reported on 08/02/2023 10/25/17   [provider]  clonazePAM (KLONOPIN) 0.5 MG tablet Take 0.25 mg by mouth at bedtime. 11/02/18   [provider]  fluticasone (FLONASE) 50 MCG/ACT nasal spray Place 1 spray into both nostrils daily as needed for allergies.    [provider]  HYDROcodone-acetaminophen (NORCO/VICODIN) 5-325 MG tablet Take 0.5-1 tablets by mouth every 6 (six) hours as needed for moderate  pain.     [provider]  lisinopril (ZESTRIL) 2.5 MG tablet Take 2.5 mg by mouth daily. 09/28/22   [provider]  metoprolol succinate (TOPROL XL) 25 MG 24 hr tablet Take 0.5 tablets (12.5 mg total) by mouth daily. 02/03/23   Hilty, Lisette Abu, MD  Multiple Vitamins-Minerals (MULTIVITAMIN WITH MINERALS) tablet Take 1 tablet by mouth daily. Patient not taking: Reported on 08/02/2023    [provider]  Multiple Vitamins-Minerals (PRESERVISION AREDS 2 PO) Take 1 capsule by mouth 2 (two) times daily.    [provider]  nitroGLYCERIN (NITROSTAT) 0.4 MG SL tablet Place 1 tablet (0.4 mg total) under the tongue every 5 (five) minutes as needed for chest pain. 06/30/22   Azalee Course, PA  PARoxetine (PAXIL) 40 MG tablet Take 40 mg by mouth at bedtime.     [provider]  Vitamin D, Ergocalciferol, (DRISDOL) 1.25 MG (50000 UNIT) CAPS capsule Take 1 capsule (50,000 Units total) by mouth every 7 (seven) days. Take for 4 weeks then start OTC vitamin D 1000 IU daily Patient not taking: Reported on 02/03/2023 06/11/22   Chrystie Nose, MD  Wheat Dextrin (BENEFIBER DRINK MIX PO) Take 1 Scoop by mouth daily.    [provider]      Allergies    Prozac [fluoxetine hcl]    Review of Systems   Review of Systems  Cardiovascular:  Positive for chest pain.  All other systems reviewed and are negative.   Physical Exam Updated Vital Signs BP 128/66   Pulse (!) 56   Temp 98.7 F (37.1 C)   Resp 17   Ht 6\' 4"  (1.93 m)   Wt 88.5 kg   SpO2 99%   BMI 23.74 kg/m  Physical Exam Vitals and nursing note reviewed.  Constitutional:      Appearance: He is well-developed.     Comments: Chronically ill but not acutely ill  HENT:     Head: Normocephalic.  Eyes:     Extraocular Movements: Extraocular movements intact.     Pupils: Pupils are equal, round, and reactive to light.  Cardiovascular:     Rate and Rhythm: Normal rate and regular rhythm.     Heart  sounds: Normal heart sounds.  Pulmonary:     Effort: Pulmonary effort is normal.     Breath sounds: Normal breath sounds.  Chest:     Comments: Mild reproducible left chest wall tenderness. Abdominal:     Palpations: Abdomen is soft.  Musculoskeletal:     Cervical back: Normal range of motion and neck supple.     Comments: Patient has scoliosis and has mild tenderness in the left scapular area  Skin:    General: Skin is warm.     Capillary Refill: Capillary refill takes less than 2 seconds.  Neurological:     General: No focal deficit present.     Mental Status: He is alert and oriented to person, place, and time.     ED Results / Procedures / Treatments   Labs (all labs ordered are listed, but only abnormal results are displayed) Labs Reviewed  BASIC METABOLIC PANEL - Abnormal; Notable for the following components:      Result Value   Glucose, Bld 185 (*)    Anion gap 4 (*)    All other components within normal limits  CBC - Abnormal; Notable for the following components:   Hemoglobin 12.2 (*)    MCV 68.3 (*)    MCH 21.0 (*)    All other components within normal limits  TROPONIN I (HIGH SENSITIVITY)  TROPONIN I (HIGH SENSITIVITY)    EKG EKG Interpretation Date/Time:  Tuesday November 29 2023 15:56:41 EST Ventricular Rate:  66 PR Interval:  188 QRS Duration:  86 QT Interval:  398 QTC Calculation: 417 R Axis:   56  Text Interpretation: Sinus rhythm with occasional Premature ventricular complexes Low voltage QRS Borderline ECG When compared with ECG of 02-Aug-2023 10:54, Premature ventricular complexes are now Present No significant change since last tracing Confirmed by Richardean Canal [id number]) on 11/29/2023 7:30:38 PM  Radiology DG Chest 2 View Result Date: 11/29/2023 CLINICAL DATA:  Chest pain. Chest heaviness for 3 days. Radiates into upper back. EXAM: CHEST - 2 VIEW COMPARISON:  Chest radiographs 03/19/2020, 11/25/2016 FINDINGS: Cardiac silhouette and mediastinal  contours are unchanged and within normal limits. Mild-to-moderate atherosclerotic calcifications. The lungs are clear. No pleural effusion pneumothorax. Moderate to high-grade dextrocurvature of the mid to lower thoracic spine. Moderate reversal of the normal thoracic kyphosis on lateral view is similar to prior. Mild-to-moderate multilevel degenerative disc changes. Cholecystectomy clips. IMPRESSION: 1. No active cardiopulmonary disease. 2. Moderate to high-grade dextrocurvature of the mid to lower thoracic spine. Electronically Signed   By: Neita Garnet M.D.   On: 11/29/2023 18:19    Procedures Procedures    Medications Ordered in ED Medications - No data  to display  ED Course/ Medical Decision Making/ A&P                                 Medical Decision Making Patrick Noble. is a 73 y.o. male here presenting with left-sided chest pain and back pain.  I think likely muscle skeletal.  Pain is not exertional and patient appears comfortable.  I have low suspicion for ACS or dissection or PE.  Plan to get CBC and BMP and troponin x 2 and chest x-ray  8:17 PM Patient had 2 negative troponins.  CBC and BMP unremarkable.  Patient is stable for discharge.  I did recommend that if he has continual pain, he needs to follow-up with cardiology  Problems Addressed: Chest pain, unspecified type: acute illness or injury  Amount and/or Complexity of Data Reviewed Labs: ordered. Decision-making details documented in ED Course. Radiology: ordered and independent interpretation performed. Decision-making details documented in ED Course.    Final Clinical Impression(s) / ED Diagnoses Final diagnoses:  None    Rx / DC Orders ED Discharge Orders     None         Charlynne Pander, MD 11/29/23 2019

## 2023-11-29 NOTE — Telephone Encounter (Signed)
 Patient identification verified by 2 forms. Marilynn Rail, RN    Called and spoke to patient  Patient states:   -has upper middle back pain   -has chest tightness and heaviness   -symptoms are constant   -does not have symptoms while sleeping, but develops throughout the day   -symptoms started a few days ago   -symptoms have become more apparent, more chest heaviness   -last time he had these symptoms they found blockages in heart and placed stents   -chest discomfort 3/10, upper back pain 3-4/10 Patient denies:   -SOB/difficulty breathing   -pain radiating down arm/jaw   -sweating  Advised patient to present to ED Patient states will present to [facility] today  Patient has no further questions at this time

## 2023-12-21 DIAGNOSIS — M5440 Lumbago with sciatica, unspecified side: Secondary | ICD-10-CM | POA: Diagnosis not present

## 2023-12-21 DIAGNOSIS — M4124 Other idiopathic scoliosis, thoracic region: Secondary | ICD-10-CM | POA: Diagnosis not present

## 2023-12-27 DIAGNOSIS — L570 Actinic keratosis: Secondary | ICD-10-CM | POA: Diagnosis not present

## 2023-12-27 DIAGNOSIS — L821 Other seborrheic keratosis: Secondary | ICD-10-CM | POA: Diagnosis not present

## 2023-12-27 DIAGNOSIS — L820 Inflamed seborrheic keratosis: Secondary | ICD-10-CM | POA: Diagnosis not present

## 2023-12-27 DIAGNOSIS — X32XXXA Exposure to sunlight, initial encounter: Secondary | ICD-10-CM | POA: Diagnosis not present

## 2023-12-28 ENCOUNTER — Other Ambulatory Visit: Payer: Self-pay | Admitting: Internal Medicine

## 2024-01-01 NOTE — Progress Notes (Unsigned)
 Cardiology Office Note:  .   Date:  01/03/2024  ID:  [name], DOB 1951/07/18, MRN [id number] PCP: Soundra Pilon, FNP  [facility] Providers Cardiologist:  Chrystie Nose, MD     History of Present Illness: .   Patrick Noble. is a 73 y.o. male with a hx of remote tobacco use, hypertension, low back pain and history of scoliosis.  Patient was previously on lisinopril, however this medication was later stopped after blood pressure normalized.  He was also on Lovaza in the past however no longer on it.  Patient was referred by his PCP to see Dr. Rennis Golden on 06/09/2022 for evaluation of shortness of breath with exertion and also chest discomfort.  Blood pressure was normal on that day.  Blood work obtained on the same day showed a creatinine 1.14.  Nonfasting glucose was 233.  Vitamin D was low, he was placed on vitamin D supplement.  Coronary CT obtained on 06/28/2022 showed 0 to 24% left main disease, 50 to 69% proximal LAD lesion, 25 to 49% mid LAD lesion, 0 to 24% D1 lesion, 0 to 24% proximal left circumflex lesion, 70 to 99% mid left circumflex lesion.  Subsequent FFR showed hemodynamically significant lesion in the proximal LAD, mid left circumflex artery and CTO of the proximal RCA.  The results are suggestive of triple-vessel disease.  Cardiac catheterization was recommended.  Subsequent cardiac catheterization performed on 07/01/2022 showed 100% proximal RCA lesion, 99% OM2 lesion, 80% proximal LAD lesion, 50% mid LAD lesion.  Patient received 1 drug-eluting stent to the proximal LAD and another DES to OM1 extending into the AV groove left circumflex artery.  CTO proximal RCA had distal collaterals from left to right.  Patient was placed on aspirin and Plavix after the procedure.    I last saw the patient on 08/02/2023 at which time he was doing well.  I removed Plavix from his medication list since he was more than 1 year out from his last PCI.  More recently, patient presented to the ED  on 11/29/2023 with chest pain.  Serial troponin was negative x 2.  Chest x-ray showed no acute finding but does reveal moderate to high-grade dextrocurvature of the mid to lower thoracic spine.  Renal function and electrolyte normal.  CBC showed hemoglobin of 12.2.  Normal white blood cell count.  Chest pain was nonexertional.  It was suspected his chest pain was musculoskeletal in nature.  Patient presents today for follow-up.  Patient presents today for follow-up.  He continued to have intermittent chest and back tightness.  This symptom does not have clear correlation with exertion.  Sometimes he feel the symptom may be slightly worse when he take a deep breath.  It is similar to what he has experienced back in 2023 prior to the stent placement.  I recommended a PET stress test to further assess.  If the PET stress test is normal, he can follow-up in 6 months.    ROS:   Patient has intermittent chest discomfort.  He denies significant shortness of breath.  He has no lower extremity edema, orthopnea or PND.  Studies Reviewed: [name]   EKG Interpretation Date/Time:  Monday January 02 2024 08:10:21 EDT Ventricular Rate:  69 PR Interval:  166 QRS Duration:  88 QT Interval:  390 QTC Calculation: 417 R Axis:   36  Text Interpretation: Normal sinus rhythm No significant ST-T wave changes When compared with ECG of 29-Nov-2023 15:56, Premature ventricular  complexes are no longer Present Confirmed by Azalee Course [phone]) on 01/02/2024 8:19:01 AM    Cardiac Studies & Procedures   ______________________________________________________________________________________________ CARDIAC CATHETERIZATION  CARDIAC CATHETERIZATION 07/01/2022  Narrative   Prox RCA lesion is 100% stenosed.   2nd Mrg lesion is 99% stenosed.   Prox LAD lesion is 80% stenosed.   Mid LAD lesion is 50% stenosed.   A drug-eluting stent was successfully placed using a SYNERGY XD 3.0X28.   A drug-eluting stent was successfully placed using  a SYNERGY XD 3.50X16.   Post intervention, there is a 0% residual stenosis.   Post intervention, there is a 0% residual stenosis.  Severe proximal LAD stenosis. Moderate mid LAD stenosis Successful PTCA/DES x 1 proximal LAD Severe obtuse marginal branch stenosis Successful PTCA/DES x 1 obtuse marginal branch extending back into the AV groove Circumflex CTO of the proximal RCA. The distal vessel fills from left to right collaterals.  Recommendations: Continue DAPT with ASA and Plavix for one year. Same day post PCI discharge.  Findings Coronary Findings Diagnostic  Dominance: Right  Left Anterior Descending Prox LAD lesion is 80% stenosed. Mid LAD lesion is 50% stenosed.  Left Circumflex Vessel is large.  Second Obtuse Marginal Branch Vessel is large in size. 2nd Mrg lesion is 99% stenosed.  Right Coronary Artery Vessel is large. Prox RCA lesion is 100% stenosed. The lesion is chronically occluded.  Third Right Posterolateral Branch Collaterals 3rd RPL filled by collaterals from 2nd Sept.  Intervention  Prox LAD lesion Stent CATH VISTA GUIDE 6FR XBLAD3.5 guide catheter was inserted. Lesion crossed with guidewire using a WIRE COUGAR XT STRL 190CM. Pre-stent angioplasty was performed using a BALLN SAPPHIRE 2.0X15. A drug-eluting stent was successfully placed using a SYNERGY XD 3.50X16. Stent strut is well apposed. Post-stent angioplasty was performed using a BALL SAPPHIRE NC24 3.75X10. Post-Intervention Lesion Assessment The intervention was successful. Pre-interventional TIMI flow is 3. Post-intervention TIMI flow is 3. No complications occurred at this lesion. There is a 0% residual stenosis post intervention.  2nd Mrg lesion Stent CATH VISTA GUIDE 6FR XBLAD3.5 guide catheter was inserted. Lesion crossed with guidewire using a WIRE COUGAR XT STRL 190CM. A drug-eluting stent was successfully placed using a SYNERGY XD 3.0X28. Stent strut is well apposed. Post-stent  angioplasty was performed using a BALL SAPPHIRE NC24 3.25X22. Post-Intervention Lesion Assessment The intervention was successful. Pre-interventional TIMI flow is 3. Post-intervention TIMI flow is 3. No complications occurred at this lesion. There is a 0% residual stenosis post intervention.     ECHOCARDIOGRAM  ECHOCARDIOGRAM COMPLETE 07/30/2022  Narrative ECHOCARDIOGRAM REPORT    Patient Name:   Patrick Noble. Date of Exam: 07/30/2022 Medical Rec #:  [id number]     Height:       76.0 in Accession #:    [id number]    Weight:       210.0 lb Date of Birth:  1951-03-29    BSA:          2.262 m Patient Age:    70 years      BP:           122/64 mmHg Patient Gender: M             HR:           67 bpm. Exam Location:  [address]  Procedure: 2D Echo, Color Doppler, Cardiac Doppler, 3D Echo and Strain Analysis  Indications:    I25.1 CAD  History:  Patient has no prior history of Echocardiogram examinations. CAD; Risk Factors:Hypertension.  Sonographer:    Jorje Guild BS, RDCS Referring Phys: [phone] Breonia Kirstein  IMPRESSIONS   1. Left ventricular ejection fraction, by estimation, is 55 to 60%. The left ventricle has normal function. The left ventricle has no regional wall motion abnormalities. Left ventricular diastolic parameters are consistent with Grade I diastolic dysfunction (impaired relaxation). The average left ventricular global longitudinal strain is -22.7 %. The global longitudinal strain is normal. 2. Right ventricular systolic function is normal. The right ventricular size is normal. 3. The mitral valve is normal in structure. Trivial mitral valve regurgitation. No evidence of mitral stenosis. 4. The aortic valve is tricuspid. Aortic valve regurgitation is not visualized. No aortic stenosis is present. 5. Aortic dilatation noted. There is mild dilatation of the ascending aorta, measuring 40 mm. 6. The inferior vena cava is normal in size with greater than 50%  respiratory variability, suggesting right atrial pressure of 3 mmHg.  Comparison(s): No prior Echocardiogram.  FINDINGS Left Ventricle: Left ventricular ejection fraction, by estimation, is 55 to 60%. The left ventricle has normal function. The left ventricle has no regional wall motion abnormalities. The average left ventricular global longitudinal strain is -22.7 %. The global longitudinal strain is normal. The left ventricular internal cavity size was normal in size. There is no left ventricular hypertrophy. Left ventricular diastolic parameters are consistent with Grade I diastolic dysfunction (impaired relaxation).  Right Ventricle: The right ventricular size is normal. Right ventricular systolic function is normal.  Left Atrium: Left atrial size was normal in size.  Right Atrium: Right atrial size was normal in size.  Pericardium: There is no evidence of pericardial effusion.  Mitral Valve: The mitral valve is normal in structure. Trivial mitral valve regurgitation. No evidence of mitral valve stenosis.  Tricuspid Valve: The tricuspid valve is normal in structure. Tricuspid valve regurgitation is trivial. No evidence of tricuspid stenosis.  Aortic Valve: The aortic valve is tricuspid. Aortic valve regurgitation is not visualized. No aortic stenosis is present.  Pulmonic Valve: The pulmonic valve was normal in structure. Pulmonic valve regurgitation is trivial. No evidence of pulmonic stenosis.  Aorta: Aortic dilatation noted. There is mild dilatation of the ascending aorta, measuring 40 mm.  Venous: The inferior vena cava is normal in size with greater than 50% respiratory variability, suggesting right atrial pressure of 3 mmHg.  IAS/Shunts: No atrial level shunt detected by color flow Doppler.   LEFT VENTRICLE PLAX 2D LVIDd:         5.00 cm   Diastology LVIDs:         3.30 cm   LV e' medial:    7.20 cm/s LV PW:         0.90 cm   LV E/e' medial:  7.2 LV IVS:        0.90 cm    LV e' lateral:   11.50 cm/s LVOT diam:     2.70 cm   LV E/e' lateral: 4.5 LV SV:         121 LV SV Index:   54        2D Longitudinal Strain LVOT Area:     5.73 cm  2D Strain GLS (A2C):   -25.7 % 2D Strain GLS (A3C):   -22.1 % 2D Strain GLS (A4C):   -20.5 % 2D Strain GLS Avg:     -22.7 %  3D Volume EF: 3D EF:        66 %  LV EDV:       108 ml LV ESV:       36 ml LV SV:        72 ml  RIGHT VENTRICLE             IVC RV Basal diam:  3.20 cm     IVC diam: 1.70 cm RV S prime:     11.30 cm/s TAPSE (M-mode): 1.8 cm  LEFT ATRIUM             Index        RIGHT ATRIUM           Index LA diam:        4.10 cm 1.81 cm/m   RA Pressure: 3.00 mmHg LA Vol (A2C):   35.5 ml 15.70 ml/m  RA Area:     18.70 cm LA Vol (A4C):   38.4 ml 16.98 ml/m  RA Volume:   45.20 ml  19.99 ml/m LA Biplane Vol: 38.4 ml 16.98 ml/m AORTIC VALVE LVOT Vmax:   91.90 cm/s LVOT Vmean:  61.000 cm/s LVOT VTI:    0.212 m  AORTA Ao Root diam: 3.80 cm Ao Asc diam:  4.00 cm  MITRAL VALVE               TRICUSPID VALVE Estimated RAP:  3.00 mmHg MV Decel Time: 236 msec MV E velocity: 52.20 cm/s  SHUNTS MV A velocity: 73.60 cm/s  Systemic VTI:  0.21 m MV E/A ratio:  0.71        Systemic Diam: 2.70 cm  Olga Millers MD Electronically signed by Olga Millers MD Signature Date/Time: 07/30/2022/1:03:25 PM    Final      CT SCANS  CT CORONARY FRACTIONAL FLOW RESERVE DATA PREP 06/28/2022  Narrative EXAM: FFRCT ANALYSIS  FINDINGS: FFRct analysis was performed on the original cardiac CT angiogram dataset. Diagrammatic representation of the FFRct analysis is provided in a separate PDF document in PACS. This dictation was created using the PDF document and an interactive 3D model of the results. 3D model is not available in the EMR/PACS. Normal FFR range is >0.80.  1. Left Main: No significant stenosis  2. LAD: CTFFR 0.73 across lesion in proximal LAD, suggesting lesion is hemodynamically  significant  3. LCX: CTFR 0.54 across lesion in mid LCX, suggesting lesion is hemodynamically significant  4. RCA: CTO proximal RCA  IMPRESSION: 1. CTFFR suggests severe triple vessel CAD. Cardiac catheterization recommended   Electronically Signed By: Epifanio Lesches M.D. On: 06/28/2022 20:54   CT SCANS  CT CORONARY MORPH W/CTA COR W/SCORE 06/28/2022  Addendum 06/29/2022  8:32 AM ADDENDUM REPORT: 06/29/2022 08:29  EXAM: OVER-READ INTERPRETATION  CT CHEST  The following report is an over-read performed by radiologist Dr. Darlys Gales [facility] on 06/29/2022. This over-read does not include interpretation of cardiac or coronary anatomy or pathology. The coronary CTA interpretation by the cardiologist is attached.  COMPARISON:  None.  FINDINGS: Vascular: No significant extracardiac vascular findings.  Mediastinum/Nodes: No lymphadenopathy.  Lungs/Pleura: Bibasilar hypoventilatory changes. No suspicious pulmonary nodules.  Upper Abdomen: No acute abnormality.  Musculoskeletal: Dextroconvex thoracic curvature. No acute osseous abnormality. No suspicious osseous lesions.  IMPRESSION: No significant extracardiac findings in the chest.   Electronically Signed By: Caprice Renshaw M.D. On: 06/29/2022 08:29  Narrative CLINICAL DATA:  20M with chest pain  EXAM: Cardiac/Coronary CTA  TECHNIQUE: The patient was scanned on a [employer].  FINDINGS: A 100 kV prospective scan was triggered  in the descending thoracic aorta at 111 HU's. Axial non-contrast 3 mm slices were carried out through the heart. The data set was analyzed on a dedicated work station and scored using the Agatson method. Gantry rotation speed was 250 msecs and collimation was .6 mm. No beta blockade and 0.8 mg of sl NTG was given. The 3D data set was reconstructed in 5% intervals of the 67-82 % of the R-R cycle. Diastolic phases were analyzed on a dedicated work  station using MPR, MIP and VRT modes. The patient received 80 cc of contrast.  Coronary Arteries:  Normal coronary origin.  Right dominance.  RCA is a large dominant artery that gives rise to PDA and PLA. CTO of proximal RCA  Left main is a large artery that gives rise to LAD and LCX arteries. Calcified plaque in left main causes 0-24% stenosis.  LAD is a large vessel. Noncalcified plaque in proximal LAD causes 50-69% stenosis. Mixed plaque in mid LAD causes 25-49% stenosis. Calcified plaque in D1 causes 0-24% stenosis  LCX is a non-dominant artery that gives rise to one large OM1 branch. Calcified plaque in proximal LCX causes 0-24% stenosis. Noncalcified plaque in mid LCX causes severe (70-99%) stenosis.  Other findings:  Left Ventricle: Normal size  Left Atrium: Normal size  Pulmonary Veins: Normal configuration  Right Ventricle: Normal size  Right Atrium: Normal size  Cardiac valves: No calcifications  Thoracic aorta: Normal size  Pulmonary Arteries: Normal size  Systemic Veins: Normal drainage  Pericardium: Normal thickness  IMPRESSION: 1. Coronary calcium score of 135. This was 52nd percentile for age and sex matched control.  2.  Normal coronary origin with right dominance.  3.  Obstructive CAD  4.  Proximal RCA CTO  5.  Noncalcified plaque in mid LCX causes severe (70-99%) stenosis  6. Noncalcified plaque in proximal LAD causes moderate (50-69%) stenosis. Will send for CTFFR  7.  Cardiac catheterization recommended  CAD-RADS 4 Severe stenosis. (70-99% or > 50% left main). Cardiac catheterization or CT FFR is recommended. Consider symptom-guided anti-ischemic pharmacotherapy as well as risk factor modification per guideline directed care.  Electronically Signed: By: Epifanio Lesches M.D. On: 06/28/2022 17:12     ______________________________________________________________________________________________      Risk  Assessment/Calculations:             Physical Exam:   VS:  BP 128/72   Pulse 69   Ht 6\' 4"  (1.93 m)   Wt 200 lb 12.8 oz (91.1 kg)   SpO2 99%   BMI 24.44 kg/m    Wt Readings from Last 3 Encounters:  01/02/24 200 lb 12.8 oz (91.1 kg)  11/29/23 195 lb (88.5 kg)  08/02/23 194 lb 3.2 oz (88.1 kg)    GEN: Well nourished, well developed in no acute distress NECK: No JVD; No carotid bruits CARDIAC: RRR, no murmurs, rubs, gallops RESPIRATORY:  Clear to auscultation without rales, wheezing or rhonchi  ABDOMEN: Soft, non-tender, non-distended EXTREMITIES:  No edema; No deformity   ASSESSMENT AND PLAN: .    Chest Pain Intermittent, non-exertional chest pain. Coronary artery disease history necessitates ruling out cardiac ischemia. - Order PET stress test to evaluate for cardiac ischemia. - Continue aspirin, Lipitor, lisinopril, and metoprolol succinate. - Instruct to report any symptom changes.  Coronary Artery Disease He was Cardy catheterization with stents in LAD and OM-1. The occluded RCA 100% managed medically. PET stress test preferred for ischemia detection. - Continue aspirin, Lipitor, lisinopril, and metoprolol succinate.  Hypertension Well-controlled with  lisinopril and metoprolol succinate. - Continue lisinopril and metoprolol succinate.  Hyperlipidemia: On Lipitor 80 mg daily    Informed Consent   Shared Decision Making/Informed Consent The risks [chest pain, shortness of breath, cardiac arrhythmias, dizziness, blood pressure fluctuations, myocardial infarction, stroke/transient ischemic attack, nausea, vomiting, allergic reaction, radiation exposure, metallic taste sensation and life-threatening complications (estimated to be 1 in 10,000)], benefits (risk stratification, diagnosing coronary artery disease, treatment guidance) and alternatives of a cardiac PET stress test were discussed in detail with Mr. Degnan and he agrees to proceed.     Dispo: follow up in 6 month  unless PET stress came back abnormal  Signed, Azalee Course, [place]

## 2024-01-02 ENCOUNTER — Encounter: Payer: Self-pay | Admitting: Physician Assistant

## 2024-01-02 ENCOUNTER — Ambulatory Visit: Payer: PPO | Attending: Physician Assistant | Admitting: Physician Assistant

## 2024-01-02 VITALS — BP 128/72 | HR 69 | Ht 76.0 in | Wt 200.8 lb

## 2024-01-02 DIAGNOSIS — I10 Essential (primary) hypertension: Secondary | ICD-10-CM | POA: Diagnosis not present

## 2024-01-02 DIAGNOSIS — I25119 Atherosclerotic heart disease of native coronary artery with unspecified angina pectoris: Secondary | ICD-10-CM | POA: Diagnosis not present

## 2024-01-02 DIAGNOSIS — E785 Hyperlipidemia, unspecified: Secondary | ICD-10-CM | POA: Diagnosis not present

## 2024-01-02 DIAGNOSIS — R0789 Other chest pain: Secondary | ICD-10-CM | POA: Diagnosis not present

## 2024-01-02 MED ORDER — METOPROLOL SUCCINATE ER 25 MG PO TB24: 12.5000 mg | ORAL_TABLET | Freq: Every day | ORAL | 3 refills | Status: DC

## 2024-01-02 NOTE — Patient Instructions (Signed)
 Medication Instructions:  NO CHANGES *If you need a refill on your cardiac medications before your next appointment, please call your pharmacy*   Lab Work: NO LABS If you have labs (blood work) drawn today and your tests are completely normal, you will receive your results only by: MyChart Message (if you have MyChart) OR A paper copy in the mail If you have any lab test that is abnormal or we need to change your treatment, we will call you to review the results.   Testing/Procedures:    Please report to Radiology at the [facility] Main Entrance 30 minutes early for your test.  [address] [place], [place] [address]  How to Prepare for Your Cardiac PET/CT Stress Test:  Nothing to eat or drink, except water, 3 hours prior to arrival time.  NO caffeine/decaffeinated products, or chocolate 12 hours prior to arrival. (Please note decaffeinated beverages (teas/coffees) still contain caffeine).  If you have caffeine within 12 hours prior, the test will need to be rescheduled.  Medication instructions: Do not take erectile dysfunction medications for 72 hours prior to test (sildenafil, tadalafil) Do not take nitrates (isosorbide mononitrate, Ranexa) the day before or day of test Do not take tamsulosin the day before or morning of test Hold theophylline containing medications for 12 hours. Hold Dipyridamole 48 hours prior to the test.  Diabetic Preparation: If able to eat breakfast prior to 3 hour fasting, you may take all medications, including your insulin. Do not worry if you miss your breakfast dose of insulin - start at your next meal. If you do not eat prior to 3 hour fast-Hold all diabetes (oral and insulin) medications. Patients who wear a continuous glucose monitor MUST remove the device prior to scanning.  You may take your remaining medications with water.  NO perfume, cologne or lotion on chest or abdomen area. FEMALES - Please avoid wearing dresses to this  appointment.  Total time is 1 to 2 hours; you may want to bring reading material for the waiting time.  In preparation for your appointment, medication and supplies will be purchased.  Appointment availability is limited, so if you need to cancel or reschedule, please call the Radiology Department Scheduler at [phone] 24 hours in advance to avoid a cancellation fee of $100.00  What to Expect When you Arrive:  Once you arrive and check in for your appointment, you will be taken to a preparation room within the Radiology Department.  A technologist or Nurse will obtain your medical history, verify that you are correctly prepped for the exam, and explain the procedure.  Afterwards, an IV will be started in your arm and electrodes will be placed on your skin for EKG monitoring during the stress portion of the exam. Then you will be escorted to the PET/CT scanner.  There, staff will get you positioned on the scanner and obtain a blood pressure and EKG.  During the exam, you will continue to be connected to the EKG and blood pressure machines.  A small, safe amount of a radioactive tracer will be injected in your IV to obtain a series of pictures of your heart along with an injection of a stress agent.    After your Exam:  It is recommended that you eat a meal and drink a caffeinated beverage to counter act any effects of the stress agent.  Drink plenty of fluids for the remainder of the day and urinate frequently for the first couple of hours after  the exam.  Your doctor will inform you of your test results within 7-10 business days.  For more information and frequently asked questions, please visit our website: [email or web address]  For questions about your test or how to prepare for your test, please call: Cardiac Imaging Nurse Navigators Office: [phone]    Follow-Up: At [facility], you and your health needs are our priority.  As part of our continuing mission  to provide you with exceptional heart care, we have created designated Provider Care Teams.  These Care Teams include your primary Cardiologist (physician) and Advanced Practice Providers (APPs -  Physician Assistants and Nurse Practitioners) who all work together to provide you with the care you need, when you need it.    Your next appointment:   6 month(s)  Provider:   Chrystie Nose, MD   Other Instructions

## 2024-01-12 DIAGNOSIS — R7303 Prediabetes: Secondary | ICD-10-CM | POA: Diagnosis not present

## 2024-01-12 DIAGNOSIS — D649 Anemia, unspecified: Secondary | ICD-10-CM | POA: Diagnosis not present

## 2024-01-12 DIAGNOSIS — E1169 Type 2 diabetes mellitus with other specified complication: Secondary | ICD-10-CM | POA: Diagnosis not present

## 2024-01-12 DIAGNOSIS — I10 Essential (primary) hypertension: Secondary | ICD-10-CM | POA: Diagnosis not present

## 2024-01-12 DIAGNOSIS — F3341 Major depressive disorder, recurrent, in partial remission: Secondary | ICD-10-CM | POA: Diagnosis not present

## 2024-01-18 DIAGNOSIS — F330 Major depressive disorder, recurrent, mild: Secondary | ICD-10-CM | POA: Diagnosis not present

## 2024-01-18 DIAGNOSIS — F411 Generalized anxiety disorder: Secondary | ICD-10-CM | POA: Diagnosis not present

## 2024-01-18 DIAGNOSIS — Z5181 Encounter for therapeutic drug level monitoring: Secondary | ICD-10-CM | POA: Diagnosis not present

## 2024-01-19 DIAGNOSIS — G4752 REM sleep behavior disorder: Secondary | ICD-10-CM | POA: Diagnosis not present

## 2024-01-19 DIAGNOSIS — G4733 Obstructive sleep apnea (adult) (pediatric): Secondary | ICD-10-CM | POA: Diagnosis not present

## 2024-01-23 DIAGNOSIS — H353121 Nonexudative age-related macular degeneration, left eye, early dry stage: Secondary | ICD-10-CM | POA: Diagnosis not present

## 2024-01-23 DIAGNOSIS — H40023 Open angle with borderline findings, high risk, bilateral: Secondary | ICD-10-CM | POA: Diagnosis not present

## 2024-01-23 DIAGNOSIS — H353112 Nonexudative age-related macular degeneration, right eye, intermediate dry stage: Secondary | ICD-10-CM | POA: Diagnosis not present

## 2024-01-23 DIAGNOSIS — H524 Presbyopia: Secondary | ICD-10-CM | POA: Diagnosis not present

## 2024-01-23 DIAGNOSIS — E119 Type 2 diabetes mellitus without complications: Secondary | ICD-10-CM | POA: Diagnosis not present

## 2024-01-31 DIAGNOSIS — Z6823 Body mass index (BMI) 23.0-23.9, adult: Secondary | ICD-10-CM | POA: Diagnosis not present

## 2024-01-31 DIAGNOSIS — N1831 Chronic kidney disease, stage 3a: Secondary | ICD-10-CM | POA: Diagnosis not present

## 2024-01-31 DIAGNOSIS — I10 Essential (primary) hypertension: Secondary | ICD-10-CM | POA: Diagnosis not present

## 2024-01-31 DIAGNOSIS — E1169 Type 2 diabetes mellitus with other specified complication: Secondary | ICD-10-CM | POA: Diagnosis not present

## 2024-02-06 DIAGNOSIS — S30860A Insect bite (nonvenomous) of lower back and pelvis, initial encounter: Secondary | ICD-10-CM | POA: Diagnosis not present

## 2024-02-06 DIAGNOSIS — W57XXXA Bitten or stung by nonvenomous insect and other nonvenomous arthropods, initial encounter: Secondary | ICD-10-CM | POA: Diagnosis not present

## 2024-02-06 DIAGNOSIS — A6920 Lyme disease, unspecified: Secondary | ICD-10-CM | POA: Diagnosis not present

## 2024-02-10 ENCOUNTER — Encounter (HOSPITAL_BASED_OUTPATIENT_CLINIC_OR_DEPARTMENT_OTHER): Payer: Self-pay | Admitting: Internal Medicine

## 2024-02-10 ENCOUNTER — Telehealth (HOSPITAL_BASED_OUTPATIENT_CLINIC_OR_DEPARTMENT_OTHER): Payer: Self-pay

## 2024-02-10 NOTE — Telephone Encounter (Signed)
 Pt called call center, call was transferred d/t being unable to send blood pressure readings through MyChart.  BP log (all readings are after meds):  102/65 129/79 124/76 139/81 132/76 120/66 101/62 121/62 120/70 120/62 133/78 133/77 109/68  Will route to MD for further recommendations. Pleas see MyChart message on 5/2 for more info.

## 2024-02-15 NOTE — Telephone Encounter (Signed)
 Any recommendations?

## 2024-02-16 ENCOUNTER — Other Ambulatory Visit

## 2024-02-21 ENCOUNTER — Encounter (HOSPITAL_COMMUNITY): Payer: Self-pay

## 2024-02-22 DIAGNOSIS — F411 Generalized anxiety disorder: Secondary | ICD-10-CM | POA: Diagnosis not present

## 2024-02-22 DIAGNOSIS — F330 Major depressive disorder, recurrent, mild: Secondary | ICD-10-CM | POA: Diagnosis not present

## 2024-02-23 ENCOUNTER — Encounter (HOSPITAL_BASED_OUTPATIENT_CLINIC_OR_DEPARTMENT_OTHER): Payer: Self-pay | Admitting: Internal Medicine

## 2024-02-23 ENCOUNTER — Ambulatory Visit
Admission: RE | Admit: 2024-02-23 | Discharge: 2024-02-23 | Disposition: A | Discharge status: Home / Self Care | Source: Ambulatory Visit | Attending: Physician Assistant | Admitting: Physician Assistant

## 2024-02-23 DIAGNOSIS — R0789 Other chest pain: Secondary | ICD-10-CM

## 2024-02-23 LAB — NM PET CT CARDIAC PERFUSION MULTI W/ABSOLUTE BLOODFLOW
LV dias vol: 88 mL (ref 62–150)
LV sys vol: 23 mL
MBFR: 1.61
Nuc Rest EF: 64 %
Nuc Stress EF: 74 %
Peak HR: 76 {beats}/min
Rest HR: 68 {beats}/min
Rest MBF: 0.87 ml/g/min
Rest Nuclear Isotope Dose: 22.1 mCi
SRS: 0
SSS: 4
Stress MBF: 1.4 ml/g/min
Stress Nuclear Isotope Dose: 21.6 mCi
TID: 0.98

## 2024-02-23 MED ORDER — REGADENOSON 0.4 MG/5ML IV SOLN
0.4000 mg | Freq: Once | INTRAVENOUS | Status: AC
  Administered 2024-02-23: 0.4 mg via INTRAVENOUS
  Filled 2024-02-23: qty 5

## 2024-02-23 MED ORDER — RUBIDIUM RB82 GENERATOR (RUBYFILL)
25.0000 | PACK | Freq: Once | INTRAVENOUS | Status: AC
  Administered 2024-02-23: 22.08 via INTRAVENOUS

## 2024-02-23 MED ORDER — RUBIDIUM RB82 GENERATOR (RUBYFILL)
25.0000 | PACK | Freq: Once | INTRAVENOUS | Status: AC
  Administered 2024-02-23: 21.63 via INTRAVENOUS

## 2024-02-23 MED ORDER — REGADENOSON 0.4 MG/5ML IV SOLN
INTRAVENOUS | Status: AC
  Filled 2024-02-23: qty 5

## 2024-02-23 NOTE — Progress Notes (Signed)
 Patient presents for a cardiac PET stress test and tolerated procedure without incident. Patient maintained acceptable vital signs throughout the test and was offered caffeine after test.  Patient ambulated out of department with a steady gait.

## 2024-02-24 ENCOUNTER — Ambulatory Visit: Payer: Self-pay | Admitting: Physician Assistant

## 2024-02-24 NOTE — Progress Notes (Signed)
 Dr. Maximo Spar, please review. Mr. Busam symptom did not have clear correlation with exertion, small area of ischemia in the location of known chronic total occluded RCA, therefore I recommend continue observation and medical therapy. What do you think?

## 2024-02-24 NOTE — Telephone Encounter (Signed)
 Please review and advise.

## 2024-02-27 MED ORDER — METOPROLOL SUCCINATE ER 25 MG PO TB24: 25.0000 mg | ORAL_TABLET | Freq: Every day | ORAL | 3 refills | Status: DC

## 2024-02-27 NOTE — Progress Notes (Signed)
 I spoke with Mr. Patrick Noble, he is still having intermittent chest pain, however not sure if it is related to his significant scoliosis. After discussing various options including increase beta blocker (metoprolol ) vs adding Imdur, we decided to increase metoprolol  succinate from 12.5mg  daily to 25mg  daily. New Rx sent to his pharmacy.

## 2024-03-08 ENCOUNTER — Other Ambulatory Visit

## 2024-03-09 DIAGNOSIS — I10 Essential (primary) hypertension: Secondary | ICD-10-CM | POA: Diagnosis not present

## 2024-03-09 DIAGNOSIS — N1831 Chronic kidney disease, stage 3a: Secondary | ICD-10-CM | POA: Diagnosis not present

## 2024-03-09 DIAGNOSIS — E1169 Type 2 diabetes mellitus with other specified complication: Secondary | ICD-10-CM | POA: Diagnosis not present

## 2024-03-10 DIAGNOSIS — F3341 Major depressive disorder, recurrent, in partial remission: Secondary | ICD-10-CM | POA: Diagnosis not present

## 2024-03-10 DIAGNOSIS — I10 Essential (primary) hypertension: Secondary | ICD-10-CM | POA: Diagnosis not present

## 2024-03-10 DIAGNOSIS — C61 Malignant neoplasm of prostate: Secondary | ICD-10-CM | POA: Diagnosis not present

## 2024-04-04 DIAGNOSIS — F330 Major depressive disorder, recurrent, mild: Secondary | ICD-10-CM | POA: Diagnosis not present

## 2024-04-04 DIAGNOSIS — F411 Generalized anxiety disorder: Secondary | ICD-10-CM | POA: Diagnosis not present

## 2024-04-05 DIAGNOSIS — F330 Major depressive disorder, recurrent, mild: Secondary | ICD-10-CM | POA: Diagnosis not present

## 2024-04-05 DIAGNOSIS — F411 Generalized anxiety disorder: Secondary | ICD-10-CM | POA: Diagnosis not present

## 2024-04-09 DIAGNOSIS — C61 Malignant neoplasm of prostate: Secondary | ICD-10-CM | POA: Diagnosis not present

## 2024-04-09 DIAGNOSIS — E1169 Type 2 diabetes mellitus with other specified complication: Secondary | ICD-10-CM | POA: Diagnosis not present

## 2024-04-09 DIAGNOSIS — F3341 Major depressive disorder, recurrent, in partial remission: Secondary | ICD-10-CM | POA: Diagnosis not present

## 2024-04-09 DIAGNOSIS — I10 Essential (primary) hypertension: Secondary | ICD-10-CM | POA: Diagnosis not present

## 2024-04-24 DIAGNOSIS — M5440 Lumbago with sciatica, unspecified side: Secondary | ICD-10-CM | POA: Diagnosis not present

## 2024-04-24 DIAGNOSIS — M4124 Other idiopathic scoliosis, thoracic region: Secondary | ICD-10-CM | POA: Diagnosis not present

## 2024-04-25 DIAGNOSIS — I10 Essential (primary) hypertension: Secondary | ICD-10-CM | POA: Diagnosis not present

## 2024-04-25 DIAGNOSIS — E1169 Type 2 diabetes mellitus with other specified complication: Secondary | ICD-10-CM | POA: Diagnosis not present

## 2024-04-26 DIAGNOSIS — F411 Generalized anxiety disorder: Secondary | ICD-10-CM | POA: Diagnosis not present

## 2024-04-26 DIAGNOSIS — F330 Major depressive disorder, recurrent, mild: Secondary | ICD-10-CM | POA: Diagnosis not present

## 2024-05-01 DIAGNOSIS — M5440 Lumbago with sciatica, unspecified side: Secondary | ICD-10-CM | POA: Diagnosis not present

## 2024-05-01 DIAGNOSIS — M4124 Other idiopathic scoliosis, thoracic region: Secondary | ICD-10-CM | POA: Diagnosis not present

## 2024-05-08 DIAGNOSIS — M4124 Other idiopathic scoliosis, thoracic region: Secondary | ICD-10-CM | POA: Diagnosis not present

## 2024-05-08 DIAGNOSIS — M5440 Lumbago with sciatica, unspecified side: Secondary | ICD-10-CM | POA: Diagnosis not present

## 2024-05-10 DIAGNOSIS — E1169 Type 2 diabetes mellitus with other specified complication: Secondary | ICD-10-CM | POA: Diagnosis not present

## 2024-05-10 DIAGNOSIS — I10 Essential (primary) hypertension: Secondary | ICD-10-CM | POA: Diagnosis not present

## 2024-05-10 DIAGNOSIS — F3341 Major depressive disorder, recurrent, in partial remission: Secondary | ICD-10-CM | POA: Diagnosis not present

## 2024-05-10 DIAGNOSIS — C61 Malignant neoplasm of prostate: Secondary | ICD-10-CM | POA: Diagnosis not present

## 2024-05-16 DIAGNOSIS — F330 Major depressive disorder, recurrent, mild: Secondary | ICD-10-CM | POA: Diagnosis not present

## 2024-05-16 DIAGNOSIS — F411 Generalized anxiety disorder: Secondary | ICD-10-CM | POA: Diagnosis not present

## 2024-05-23 DIAGNOSIS — G4752 REM sleep behavior disorder: Secondary | ICD-10-CM | POA: Diagnosis not present

## 2024-05-23 DIAGNOSIS — G4733 Obstructive sleep apnea (adult) (pediatric): Secondary | ICD-10-CM | POA: Diagnosis not present

## 2024-05-24 DIAGNOSIS — N400 Enlarged prostate without lower urinary tract symptoms: Secondary | ICD-10-CM | POA: Diagnosis not present

## 2024-05-24 DIAGNOSIS — E785 Hyperlipidemia, unspecified: Secondary | ICD-10-CM | POA: Diagnosis not present

## 2024-05-24 DIAGNOSIS — N1831 Chronic kidney disease, stage 3a: Secondary | ICD-10-CM | POA: Diagnosis not present

## 2024-05-24 DIAGNOSIS — F332 Major depressive disorder, recurrent severe without psychotic features: Secondary | ICD-10-CM | POA: Diagnosis not present

## 2024-05-24 DIAGNOSIS — C61 Malignant neoplasm of prostate: Secondary | ICD-10-CM | POA: Diagnosis not present

## 2024-05-24 DIAGNOSIS — E119 Type 2 diabetes mellitus without complications: Secondary | ICD-10-CM | POA: Diagnosis not present

## 2024-05-24 DIAGNOSIS — G4733 Obstructive sleep apnea (adult) (pediatric): Secondary | ICD-10-CM | POA: Diagnosis not present

## 2024-05-24 DIAGNOSIS — E1169 Type 2 diabetes mellitus with other specified complication: Secondary | ICD-10-CM | POA: Diagnosis not present

## 2024-05-24 DIAGNOSIS — K219 Gastro-esophageal reflux disease without esophagitis: Secondary | ICD-10-CM | POA: Diagnosis not present

## 2024-05-24 DIAGNOSIS — E1122 Type 2 diabetes mellitus with diabetic chronic kidney disease: Secondary | ICD-10-CM | POA: Diagnosis not present

## 2024-05-24 DIAGNOSIS — I2510 Atherosclerotic heart disease of native coronary artery without angina pectoris: Secondary | ICD-10-CM | POA: Diagnosis not present

## 2024-05-29 ENCOUNTER — Ambulatory Visit (INDEPENDENT_AMBULATORY_CARE_PROVIDER_SITE_OTHER): Admitting: Podiatry

## 2024-05-29 VITALS — Ht 76.0 in | Wt 185.0 lb

## 2024-05-29 DIAGNOSIS — E119 Type 2 diabetes mellitus without complications: Secondary | ICD-10-CM | POA: Diagnosis not present

## 2024-05-29 DIAGNOSIS — B351 Tinea unguium: Secondary | ICD-10-CM

## 2024-05-30 NOTE — Progress Notes (Signed)
  Subjective:  Patient ID: Gaither JINNY Jama Mickey., male    DOB: 05-06-1951,  MRN: [id number]  Chief Complaint  Patient presents with   Nail Problem    Rm 3 New Paient Diabetic/Fungus on right hallux and left hallux tone nailws removed with partial regrowth.     Discussed the use of AI scribe software for clinical note transcription with the patient, who gave verbal consent to proceed.  History of Present Illness Rolla Kedzierski. is a 73 year old male with diabetes and chronic kidney disease who presents with toenail fungus.  He has toenail fungus, with one toenail previously cut and removed but has since grown back. No pain is associated with the toenail fungus. He has not been treated for the nail fungus before. He has discussed various treatment options, including oral medications, nail removal, and laser treatment.  He is currently taking several medications, including a statin, Jardiance, and Wellbutrin. No itching in the feet or between the toes.  He has a history of diabetes, with an A1c level of 7 in April, and chronic kidney disease. His A1c is currently around 6. He reports occasional numbness or tingling at the tip of the toes, which is sporadic.      Objective:    Physical Exam VASCULAR: DP and PT pulse palpable. Foot is warm and well-perfused. Capillary fill time is brisk. DERMATOLOGIC: Mycotic right hallux nail, dystrophic second toenail. Normal skin turgor, texture, and temperature. No open lesions, rashes, or ulcerations. NEUROLOGIC: Sensation intact. Normal sensation to light touch and pressure. No paresthesias. ORTHOPEDIC: Smooth pain-free range of motion of all examined joints. No ecchymosis or bruising. No gross deformity. No pain to palpation.   No images are attached to the encounter.    Results A1c: 7% (01/2024)   Assessment:   1. Onychomycosis   2. Encounter for diabetic foot exam [facility])      Plan:  Patient was evaluated and treated and all questions  answered.  Assessment and Plan Assessment & Plan Onychomycosis of right great toenail Chronic onychomycosis of the right great toenail without pain or discomfort. Potential for spreading to other toenails or skin, possibly causing tinea pedis. - Educate on using separate nail clippers for the affected toenail to prevent spread. - Consider oral Lamisil if treatment is desired, with monitoring of liver function due to potential side effects such as nausea, cramping, diarrhea, and rare muscle aches when taken with statins.  Currently states he is not interested in new medications to treat this.  He likely will choose to live the condition and notify me if it worsens and bothersome. - Consider nail removal or laser treatment if oral medication is not preferred, noting that laser treatment is not covered by insurance. - Advise on the use of nail nippers for easier trimming of the affected nail.  Nail dystrophy of second toenail Nail dystrophy of the second toenail attributed to pressure from the shape of the toes rather than fungal infection. - Monitor for changes or symptoms.  Type 2 diabetes mellitus Type 2 diabetes mellitus with a recent A1c of 7. No current issues with circulation or sensation in the feet, though occasional tingling at the tip of the toes noted. - Continue current diabetes management to maintain A1c around 6 to prevent complications such as neuropathy. - Monitor for any changes in sensation or circulation in the feet.      No follow-ups on file.

## 2024-06-04 ENCOUNTER — Other Ambulatory Visit: Payer: Self-pay | Admitting: Physician Assistant

## 2024-06-06 DIAGNOSIS — S80862A Insect bite (nonvenomous), left lower leg, initial encounter: Secondary | ICD-10-CM | POA: Diagnosis not present

## 2024-06-06 DIAGNOSIS — E1169 Type 2 diabetes mellitus with other specified complication: Secondary | ICD-10-CM | POA: Diagnosis not present

## 2024-06-06 DIAGNOSIS — S80861A Insect bite (nonvenomous), right lower leg, initial encounter: Secondary | ICD-10-CM | POA: Diagnosis not present

## 2024-06-06 DIAGNOSIS — W57XXXA Bitten or stung by nonvenomous insect and other nonvenomous arthropods, initial encounter: Secondary | ICD-10-CM | POA: Diagnosis not present

## 2024-06-06 DIAGNOSIS — G4733 Obstructive sleep apnea (adult) (pediatric): Secondary | ICD-10-CM | POA: Diagnosis not present

## 2024-06-06 DIAGNOSIS — N1831 Chronic kidney disease, stage 3a: Secondary | ICD-10-CM | POA: Diagnosis not present

## 2024-06-06 DIAGNOSIS — I10 Essential (primary) hypertension: Secondary | ICD-10-CM | POA: Diagnosis not present

## 2024-06-07 DIAGNOSIS — F330 Major depressive disorder, recurrent, mild: Secondary | ICD-10-CM | POA: Diagnosis not present

## 2024-06-07 DIAGNOSIS — F411 Generalized anxiety disorder: Secondary | ICD-10-CM | POA: Diagnosis not present

## 2024-06-10 DIAGNOSIS — F3341 Major depressive disorder, recurrent, in partial remission: Secondary | ICD-10-CM | POA: Diagnosis not present

## 2024-06-10 DIAGNOSIS — C61 Malignant neoplasm of prostate: Secondary | ICD-10-CM | POA: Diagnosis not present

## 2024-06-10 DIAGNOSIS — I10 Essential (primary) hypertension: Secondary | ICD-10-CM | POA: Diagnosis not present

## 2024-06-10 DIAGNOSIS — E1169 Type 2 diabetes mellitus with other specified complication: Secondary | ICD-10-CM | POA: Diagnosis not present

## 2024-06-15 ENCOUNTER — Ambulatory Visit (HOSPITAL_COMMUNITY)
Admission: RE | Admit: 2024-06-15 | Discharge: 2024-06-15 | Disposition: A | Discharge status: Home / Self Care | Source: Ambulatory Visit | Attending: Internal Medicine | Admitting: Internal Medicine

## 2024-06-15 DIAGNOSIS — I6523 Occlusion and stenosis of bilateral carotid arteries: Secondary | ICD-10-CM | POA: Diagnosis not present

## 2024-06-20 ENCOUNTER — Encounter: Payer: Self-pay | Admitting: Internal Medicine

## 2024-06-21 ENCOUNTER — Ambulatory Visit: Payer: Self-pay | Admitting: Internal Medicine

## 2024-06-25 DIAGNOSIS — G4733 Obstructive sleep apnea (adult) (pediatric): Secondary | ICD-10-CM | POA: Diagnosis not present

## 2024-06-27 DIAGNOSIS — F330 Major depressive disorder, recurrent, mild: Secondary | ICD-10-CM | POA: Diagnosis not present

## 2024-06-27 DIAGNOSIS — F411 Generalized anxiety disorder: Secondary | ICD-10-CM | POA: Diagnosis not present

## 2024-06-29 DIAGNOSIS — F330 Major depressive disorder, recurrent, mild: Secondary | ICD-10-CM | POA: Diagnosis not present

## 2024-06-29 DIAGNOSIS — F411 Generalized anxiety disorder: Secondary | ICD-10-CM | POA: Diagnosis not present

## 2024-07-02 DIAGNOSIS — G4733 Obstructive sleep apnea (adult) (pediatric): Secondary | ICD-10-CM | POA: Diagnosis not present

## 2024-07-10 DIAGNOSIS — I10 Essential (primary) hypertension: Secondary | ICD-10-CM | POA: Diagnosis not present

## 2024-07-10 DIAGNOSIS — E1169 Type 2 diabetes mellitus with other specified complication: Secondary | ICD-10-CM | POA: Diagnosis not present

## 2024-07-10 DIAGNOSIS — F3341 Major depressive disorder, recurrent, in partial remission: Secondary | ICD-10-CM | POA: Diagnosis not present

## 2024-07-10 DIAGNOSIS — C61 Malignant neoplasm of prostate: Secondary | ICD-10-CM | POA: Diagnosis not present

## 2024-07-18 DIAGNOSIS — N1831 Chronic kidney disease, stage 3a: Secondary | ICD-10-CM | POA: Diagnosis not present

## 2024-07-18 DIAGNOSIS — E1169 Type 2 diabetes mellitus with other specified complication: Secondary | ICD-10-CM | POA: Diagnosis not present

## 2024-07-18 DIAGNOSIS — I10 Essential (primary) hypertension: Secondary | ICD-10-CM | POA: Diagnosis not present

## 2024-07-25 DIAGNOSIS — G4733 Obstructive sleep apnea (adult) (pediatric): Secondary | ICD-10-CM | POA: Diagnosis not present

## 2024-07-27 DIAGNOSIS — M79672 Pain in left foot: Secondary | ICD-10-CM | POA: Diagnosis not present

## 2024-07-27 DIAGNOSIS — M7750 Other enthesopathy of unspecified foot: Secondary | ICD-10-CM | POA: Diagnosis not present

## 2024-08-08 DIAGNOSIS — F330 Major depressive disorder, recurrent, mild: Secondary | ICD-10-CM | POA: Diagnosis not present

## 2024-08-08 DIAGNOSIS — F411 Generalized anxiety disorder: Secondary | ICD-10-CM | POA: Diagnosis not present

## 2024-08-10 DIAGNOSIS — I10 Essential (primary) hypertension: Secondary | ICD-10-CM | POA: Diagnosis not present

## 2024-08-10 DIAGNOSIS — E1169 Type 2 diabetes mellitus with other specified complication: Secondary | ICD-10-CM | POA: Diagnosis not present

## 2024-08-10 DIAGNOSIS — F3341 Major depressive disorder, recurrent, in partial remission: Secondary | ICD-10-CM | POA: Diagnosis not present

## 2024-08-10 DIAGNOSIS — C61 Malignant neoplasm of prostate: Secondary | ICD-10-CM | POA: Diagnosis not present

## 2024-08-22 ENCOUNTER — Ambulatory Visit: Attending: Student in an Organized Health Care Education/Training Program | Admitting: Internal Medicine

## 2024-08-22 ENCOUNTER — Encounter: Payer: Self-pay | Admitting: Internal Medicine

## 2024-08-22 VITALS — BP 97/57 | HR 66 | Resp 16 | Ht 76.0 in | Wt 182.5 lb

## 2024-08-22 DIAGNOSIS — I25119 Atherosclerotic heart disease of native coronary artery with unspecified angina pectoris: Secondary | ICD-10-CM

## 2024-08-22 DIAGNOSIS — I6523 Occlusion and stenosis of bilateral carotid arteries: Secondary | ICD-10-CM | POA: Diagnosis not present

## 2024-08-22 DIAGNOSIS — I951 Orthostatic hypotension: Secondary | ICD-10-CM

## 2024-08-22 DIAGNOSIS — E785 Hyperlipidemia, unspecified: Secondary | ICD-10-CM | POA: Diagnosis not present

## 2024-08-22 NOTE — Progress Notes (Signed)
 OFFICE CONSULT NOTE  Chief Complaint:  Follow-up  Primary Care Physician: Marvene Prentice SAUNDERS, FNP  HPI:  Patrick Hippert. is a 73 y.o. male who is being seen today for the evaluation of shortness of breath and chest tightness at the request of Marvene Prentice SAUNDERS, FNP. this is a pleasant 73 year old male whose wife is a patient of mine.  He has recently had worsening shortness of breath and chest tightness.  He reports longstanding history of significant scoliosis, diagnosed at a young age.  He also has a history of hypertension, kidney stones and chronic pain.  He is concerned about his tightness and shortness of breath as it may represent heart disease.  He reports his father had an MI at age 48.  He also was noted to have bilateral carotid artery disease but was a smoker.  Patrick Noble has a remote history of smoking but quit more than 30 years ago.  He previously was on lisinopril for hypertension but says he stopped it and his blood pressures have been normal.  He also used to take Lovaza but is no longer on that.  His most recent recent lipid profile was in October 2022 showing total cholesterol 178, HDL 32, triglycerides 172 and LDL 115.  Renal function was normal.  A1c 5.8%.  EKG today shows normal sinus rhythm at 86 and possible left atrial enlargement with nonspecific ST changes.  07/20/2022  Patrick Noble returns today for follow-up of his heart catheterization.  He underwent CT coronary angiography for symptoms concerning for unstable angina.  CT FFR suggested significant multivessel disease and catheterization was recommended.  He underwent left heart catheterization on 07/01/2022, demonstrating 100% proximal RCA stenosis with left-to-right collaterals.  There was an 80% proximal LAD stenosis and 99% stenosis of a circumflex marginal branch.  He underwent drug-eluting stent placement to both vessels and did well.  It was recommended he remain on aspirin  and Plavix  dual antiplatelet therapy for 1 year.   Since then he has done well.  He notes some improvement in his shortness of breath and exercise tolerance.  He has not coming echocardiogram on October 20.  No bleeding issues on dual antiplatelet therapy.  He has not had a recent repeat lipid profile although he did just see his primary care provider.  EKG today is normal.  02/02/2023  Patrick Noble returns today for follow-up.  Overall he is doing well.  He has had no further chest pain.  He gets some occasional upper back pain when exercising.  He is lost a good bit of weight.  He completed the cardiac rehab program and did very well with the special diet program.  He is a little frustrated that he did not have an improvement in A1c which was 6.2%.  Previously it was 5.8%.  His repeat labs however are outstanding.  His LP(a) was negative.  Vitamin D  has come up from 24-66.2.  His LDL particle number was 627, LDL-C of 48, HDL-C was 33 and triglycerides 96.  He is still on dual antiplatelet therapy with a plan to stop clopidogrel  in October.  He was having some issues with dizziness which I felt might have been related to low blood pressure with his weight loss.  He backed off on his beta-blocker but currently is only taking 12 and half milligrams of metoprolol  tartrate once a day.  08/22/2024  Patrick Noble is seen today in follow-up.  Overall he says he is feeling well.  Denies  any chest pain or shortness of breath.  Of note his blood pressure was low today 97/57.  He has had an issue with this recently.  He was taken off of lisinopril and he was having some issues with dizziness.  I also reduced his metoprolol  from 25 to 12.5 mg daily.  Heart rates in the 60s.  He does report still some possible orthostatic symptoms.  There is occasional ringing in his ear however which makes me wonder if this is more of an inner ear issue.  Beyond that he denies chest pain or shortness of breath.  He did have carotid Dopplers recently which showed minimal bilateral carotid disease.   He also had a cardiac PET scan earlier this year which showed no reversible ischemia but a fixed defect in the area of the right coronary artery CTO which is known to be collateralized.  LVEF was normal.  PMHx:  Past Medical History:  Diagnosis Date   Coronary artery disease    Degenerative arthritis of lumbar spine    Hyperlipidemia    Hypertension    Kidney stone    Prostate pain    Scoliosis     Past Surgical History:  Procedure Laterality Date   CARDIAC CATHETERIZATION     CHOLECYSTECTOMY     CORONARY STENT INTERVENTION N/A 07/01/2022   Procedure: CORONARY STENT INTERVENTION;  Surgeon: Verlin Lonni BIRCH, MD;  Location: [facility];  Service: Cardiovascular;  Laterality: N/A;   injections in the back     KNEE ARTHROSCOPY     LEFT HEART CATH AND CORONARY ANGIOGRAPHY N/A 07/01/2022   Procedure: LEFT HEART CATH AND CORONARY ANGIOGRAPHY;  Surgeon: Verlin Lonni BIRCH, MD;  Location: [facility]AS[facility] CV [facility];  Service: Cardiovascular;  Laterality: N/A;    FAMHx:  Family History  Problem Relation Age of Onset   Cancer Mother    Cancer Father    Heart attack Father    Neuropathy Father     SOCHx:   reports that he has quit smoking. He has never used smokeless tobacco. He reports current alcohol use. He reports that he does not use drugs.  ALLERGIES:  Allergies  Allergen Reactions   Prozac [Fluoxetine Hcl] Anxiety    ROS: Pertinent items noted in HPI and remainder of comprehensive ROS otherwise negative.  HOME MEDS: Current Outpatient Medications on File Prior to Visit  Medication Sig Dispense Refill   acetaminophen  (TYLENOL ) 500 MG tablet Take 1,000 mg by mouth every 6 (six) hours as needed for moderate pain.     aspirin  EC 81 MG tablet Take 1 tablet (81 mg total) by mouth daily. Swallow whole. 90 tablet 3   atorvastatin  (LIPITOR) 80 MG tablet TAKE 1 TABLET BY MOUTH EVERY DAY 90 tablet 3   buPROPion (WELLBUTRIN XL) 300 MG 24 hr tablet Take 300 mg by mouth  daily.     Cholecalciferol 50 MCG (2000 UT) TABS Take 5,000 Units by mouth daily.     clonazePAM (KLONOPIN) 0.5 MG tablet Take 0.25 mg by mouth at bedtime.     fluticasone (FLONASE) 50 MCG/ACT nasal spray Place 1 spray into both nostrils daily as needed for allergies.     HYDROcodone-acetaminophen  (NORCO/VICODIN) 5-325 MG tablet Take 0.5-1 tablets by mouth every 6 (six) hours as needed for moderate pain.      JARDIANCE 10 MG TABS tablet Take 10 mg by mouth daily.     metoprolol  succinate (TOPROL -XL) 25 MG 24 hr tablet Take 1 tablet (25 mg total)  by mouth daily. 90 tablet 3   Multiple Vitamins-Minerals (MULTIVITAMIN WITH MINERALS) tablet Take 1 tablet by mouth daily.     Multiple Vitamins-Minerals (PRESERVISION AREDS 2 PO) Take 1 capsule by mouth 2 (two) times daily.     nitroGLYCERIN  (NITROSTAT ) 0.4 MG SL tablet Place 1 tablet (0.4 mg total) under the tongue every 5 (five) minutes as needed for chest pain. 30 tablet 2   PARoxetine (PAXIL) 40 MG tablet Take 40 mg by mouth at bedtime.      Vitamin D , Ergocalciferol , (DRISDOL ) 1.25 MG (50000 UNIT) CAPS capsule Take 1 capsule (50,000 Units total) by mouth every 7 (seven) days. Take for 4 weeks then start OTC vitamin D  1000 IU daily 4 capsule 0   Wheat Dextrin (BENEFIBER DRINK MIX PO) Take 1 Scoop by mouth daily.     No current facility-administered medications on file prior to visit.    LABS/IMAGING: No results found for this or any previous visit (from the past 48 hours). No results found.  LIPID PANEL: No results found for: CHOL, TRIG, HDL, CHOLHDL, VLDL, LDLCALC, LDLDIRECT  WEIGHTS: Wt Readings from Last 3 Encounters:  08/22/24 182 lb 8 oz (82.8 kg)  05/29/24 185 lb (83.9 kg)  02/23/24 185 lb (83.9 kg)    VITALS: BP (!) 97/57 (BP Location: Left Arm, Patient Position: Sitting, Cuff Size: Normal)   Pulse 66   Resp 16   Ht 6' 4 (1.93 m)   Wt 182 lb 8 oz (82.8 kg)   SpO2 97%   BMI 22.21 kg/m   EXAM: General  appearance: alert and no distress Neck: no carotid bruit, no JVD, thyroid  not enlarged, symmetric, no tenderness/mass/nodules, and right earlobe crease Lungs: clear to auscultation bilaterally Heart: regular rate and rhythm, S1, S2 normal, no murmur, click, rub or gallop Abdomen: soft, non-tender; bowel sounds normal; no masses,  no organomegaly Extremities: extremities normal, atraumatic, no cyanosis or edema and marked thoracic scoliosis Pulses: 2+ and symmetric Skin: Skin color, texture, turgor normal. No rashes or lesions Neurologic: Grossly normal Psych: Pleasant  EKG: EKG Interpretation Date/Time:  Wednesday August 22 2024 08:28:10 EST Ventricular Rate:  65 PR Interval:  190 QRS Duration:  86 QT Interval:  392 QTC Calculation: 407 R Axis:   -21  Text Interpretation: Normal sinus rhythm Normal ECG When compared with ECG of 02-Jan-2024 08:10, Questionable change in QRS axis Nonspecific T wave abnormality no longer evident in Inferior leads Confirmed by Mona Kent [phone]) on 08/22/2024 8:37:23 AM    ASSESSMENT: CAD status post two-vessel PCI/DES to the LAD and marginal branch with proximal RCA occlusion and left-to-right collaterals (06/2022) Progressive dyspnea on exertion and chest tightness History of premature coronary artery disease in his father History of hypertension-resolved Remote smoking history Vitamin D  deficiency Dyslipidemia, goal LDL <70 OSA, intolerant of CPAP  PLAN: 1.   Mr. Shillingford is still having some issues with dizziness particular when changing position.  Blood pressure was low today.  I advise stopping the metoprolol  completely.  He should monitor blood pressures more closely at home particular if he feels dizzy.  Also if he continues to have some issues with ringing in his ears or imbalance he may wish to see ENT as there could be Mnire's or some type of inner ear issue.  He had a reassuring cardiac PET test earlier this year which was negative for  ischemia but did show a fixed defect in the RCA territory which is expected.  His lipids have been well-controlled  but he is overdue for a recheck.  Will order that today since he is fasting.  He also noted today that he has been intolerant of his CPAP.  He would like to try to get fitted for a dental appliance.  Given his heart history a dental appliance would be helpful to treat his sleep apnea, certainly better than no treatment at all.  Plan follow-up annually or sooner as necessary.  Patrick KYM Maxcy, MD, [facility], FNLA, FACP  [place]  [facility]  Medical Director of the Advanced Lipid Disorders &  Cardiovascular Risk Reduction Clinic Diplomate of the American Board of Clinical Lipidology Attending Cardiologist  Direct Dial: [phone]  Fax: [phone]  Website:  [email or web address] Patrick Noble 08/22/2024, 8:37 AM

## 2024-08-22 NOTE — Patient Instructions (Signed)
 Medication Instructions:   STOP metoprolol  succinate  *If you need a refill on your cardiac medications before your next appointment, please call your pharmacy*  Lab Work:  FASTING NMR lipoprofile to check cholesterol   If you have labs (blood work) drawn today and your tests are completely normal, you will receive your results only by: MyChart Message (if you have MyChart) OR A paper copy in the mail If you have any lab test that is abnormal or we need to change your treatment, we will call you to review the results.  Follow-Up: At [facility], you and your health needs are our priority.  As part of our continuing mission to provide you with exceptional heart care, our providers are all part of one team.  This team includes your primary Cardiologist (physician) and Advanced Practice Providers or APPs (Physician Assistants and Nurse Practitioners) who all work together to provide you with the care you need, when you need it.  Your next appointment:    12 months with Dr. Mona  We recommend signing up for the patient portal called MyChart.  Sign up information is provided on this After Visit Summary.  MyChart is used to connect with patients for Virtual Visits (Telemedicine).  Patients are able to view lab/test results, encounter notes, upcoming appointments, etc.  Non-urgent messages can be sent to your provider as well.   To learn more about what you can do with MyChart, go to [email or web address].   Other Instructions

## 2024-08-27 ENCOUNTER — Encounter: Payer: Self-pay | Admitting: Internal Medicine

## 2024-08-27 ENCOUNTER — Ambulatory Visit: Payer: Self-pay | Admitting: Internal Medicine

## 2024-08-27 DIAGNOSIS — E785 Hyperlipidemia, unspecified: Secondary | ICD-10-CM

## 2024-08-27 LAB — NMR, LIPOPROFILE

## 2024-09-03 DIAGNOSIS — E785 Hyperlipidemia, unspecified: Secondary | ICD-10-CM | POA: Diagnosis not present

## 2024-09-04 DIAGNOSIS — N1831 Chronic kidney disease, stage 3a: Secondary | ICD-10-CM | POA: Diagnosis not present

## 2024-09-04 DIAGNOSIS — E1169 Type 2 diabetes mellitus with other specified complication: Secondary | ICD-10-CM | POA: Diagnosis not present

## 2024-09-04 LAB — NMR, LIPOPROFILE
Cholesterol, Total: 125 mg/dL (ref 100–199)
HDL Particle Number: 33.5 umol/L (ref >=30.5)
HDL-C: 53 mg/dL (ref >39)
LDL Particle Number: 568 nmol/L (ref <1000)
LDL Size: 20.2 nm — ABNORMAL LOW (ref >20.5)
LDL-C (NIH Calc): 55 mg/dL (ref 0–99)
LP-IR Score: 43 (ref <=45)
Small LDL Particle Number: 344 nmol/L (ref <=527)
Triglycerides: 87 mg/dL (ref 0–149)

## 2024-09-10 ENCOUNTER — Ambulatory Visit: Payer: Self-pay | Admitting: Internal Medicine
